# Patient Record
Sex: Female | Born: 1935 | Race: White | Hispanic: No | Marital: Married
Health system: Southern US, Community
[De-identification: ages and names within clinical notes are randomized; demographics above are authoritative.]

## PROBLEM LIST (undated history)

## (undated) DIAGNOSIS — E079 Disorder of thyroid, unspecified: Secondary | ICD-10-CM

## (undated) HISTORY — PX: APPENDECTOMY: SHX54

---

## 1999-11-08 ENCOUNTER — Ambulatory Visit (HOSPITAL_COMMUNITY): Admission: RE | Admit: 1999-11-08 | Discharge: 1999-11-08 | Payer: Self-pay | Admitting: Family Medicine

## 2000-07-16 ENCOUNTER — Encounter: Payer: Self-pay | Admitting: Family Medicine

## 2000-07-16 ENCOUNTER — Encounter: Admission: RE | Admit: 2000-07-16 | Discharge: 2000-07-16 | Payer: Self-pay | Admitting: Family Medicine

## 2000-09-07 ENCOUNTER — Other Ambulatory Visit: Admission: RE | Admit: 2000-09-07 | Discharge: 2000-09-07 | Payer: Self-pay | Admitting: Family Medicine

## 2002-03-15 ENCOUNTER — Encounter: Admission: RE | Admit: 2002-03-15 | Discharge: 2002-03-15 | Payer: Self-pay | Admitting: Family Medicine

## 2002-03-15 ENCOUNTER — Encounter: Payer: Self-pay | Admitting: Family Medicine

## 2003-09-19 ENCOUNTER — Encounter: Payer: Self-pay | Admitting: Family Medicine

## 2003-09-19 ENCOUNTER — Encounter: Admission: RE | Admit: 2003-09-19 | Discharge: 2003-09-19 | Payer: Self-pay | Admitting: Family Medicine

## 2004-02-14 ENCOUNTER — Emergency Department (HOSPITAL_COMMUNITY): Admission: EM | Admit: 2004-02-14 | Discharge: 2004-02-14 | Payer: Self-pay | Admitting: Emergency Medicine

## 2004-07-24 ENCOUNTER — Encounter (INDEPENDENT_AMBULATORY_CARE_PROVIDER_SITE_OTHER): Payer: Self-pay | Admitting: *Deleted

## 2004-07-24 ENCOUNTER — Ambulatory Visit (HOSPITAL_BASED_OUTPATIENT_CLINIC_OR_DEPARTMENT_OTHER): Admission: RE | Admit: 2004-07-24 | Discharge: 2004-07-24 | Payer: Self-pay | Admitting: Plastic Surgery

## 2004-07-24 ENCOUNTER — Ambulatory Visit (HOSPITAL_COMMUNITY): Admission: RE | Admit: 2004-07-24 | Discharge: 2004-07-24 | Payer: Self-pay | Admitting: Plastic Surgery

## 2007-10-10 ENCOUNTER — Emergency Department (HOSPITAL_COMMUNITY): Admission: EM | Admit: 2007-10-10 | Discharge: 2007-10-10 | Payer: Self-pay | Admitting: Emergency Medicine

## 2010-12-17 ENCOUNTER — Encounter
Admission: RE | Admit: 2010-12-17 | Discharge: 2010-12-17 | Payer: Medicare Other | Source: Home / Self Care | Attending: Family Medicine | Admitting: Family Medicine

## 2011-04-18 NOTE — Op Note (Signed)
[name] [name], [name] [name]                        ACCOUNT NO.:  [id number]   MEDICAL RECORD NO.:  [id number]                   PATIENT TYPE:  AMB   LOCATION:  DSC                                  FACILITY:  [facility]   PHYSICIAN:  Consuello Bossier., M.D.         DATE OF BIRTH:  Jan 27, 1924   DATE OF PROCEDURE:  DATE OF DISCHARGE:                                 OPERATIVE REPORT   DATE OF SURGERY:  July 24, 2004.   PREOPERATIVE DIAGNOSES:  One centimeter pigmented basal cell carcinoma of  left forehead with excision and closure of 2.5 cm complex wound.   POSTOPERATIVE DIAGNOSES:  One centimeter pigmented basal cell carcinoma of  left forehead with excision and closure of 2.5 cm complex wound.   SURGEON:  Pleas Patricia, Montez Hageman.,  MD.   ANESTHESIA:  Xylocaine 2% with epinephrine 1:100,000.   FINDINGS:  The patient had a recent biopsy of a pigmented basal cell  carcinoma of the left forehead for which the above-surgical procedure was  carried out.   PROCEDURE:  The patient was brought to the operating room, marked off for  the planned elliptical excision in the wrinkle lines of the above lesion.  She was prepped with Betadine, draped sterilely, and anesthetized with  Xylocaine 2% with epinephrine 1:100,000.  Following the onset of anesthesia,  the excisional biopsy was performed.  Bleeding was controlled with the eye  cautery unit.  The wound was then approximated with interrupted and running  #6-0 Prolene producing the 2.5 cm in length closure as noted above.  Neosporin ointment and light compressive dressings were applied.  The  patient tolerated the procedure well and was able to be discharged from the  operating room  to the recovery room, and subsequently to be followed by me  as an outpatient.                                               Consuello Bossier., M.D.    Tery Sanfilippo  D:  07/24/2004  T:  07/24/2004  Job:  [id number]

## 2014-06-06 ENCOUNTER — Other Ambulatory Visit: Payer: Self-pay | Admitting: Dermatology

## 2015-03-07 DIAGNOSIS — H3532 Exudative age-related macular degeneration: Secondary | ICD-10-CM | POA: Diagnosis not present

## 2015-03-07 DIAGNOSIS — H35052 Retinal neovascularization, unspecified, left eye: Secondary | ICD-10-CM | POA: Diagnosis not present

## 2015-08-27 DIAGNOSIS — H3532 Exudative age-related macular degeneration: Secondary | ICD-10-CM | POA: Diagnosis not present

## 2015-08-27 DIAGNOSIS — H35352 Cystoid macular degeneration, left eye: Secondary | ICD-10-CM | POA: Diagnosis not present

## 2015-08-27 DIAGNOSIS — H35351 Cystoid macular degeneration, right eye: Secondary | ICD-10-CM | POA: Diagnosis not present

## 2015-10-16 DIAGNOSIS — L820 Inflamed seborrheic keratosis: Secondary | ICD-10-CM | POA: Diagnosis not present

## 2015-10-16 DIAGNOSIS — I8310 Varicose veins of unspecified lower extremity with inflammation: Secondary | ICD-10-CM | POA: Diagnosis not present

## 2015-10-22 DIAGNOSIS — R609 Edema, unspecified: Secondary | ICD-10-CM | POA: Diagnosis not present

## 2015-10-22 DIAGNOSIS — L97509 Non-pressure chronic ulcer of other part of unspecified foot with unspecified severity: Secondary | ICD-10-CM | POA: Diagnosis not present

## 2015-12-10 DIAGNOSIS — K219 Gastro-esophageal reflux disease without esophagitis: Secondary | ICD-10-CM | POA: Diagnosis not present

## 2015-12-10 DIAGNOSIS — R609 Edema, unspecified: Secondary | ICD-10-CM | POA: Diagnosis not present

## 2015-12-10 DIAGNOSIS — Z79899 Other long term (current) drug therapy: Secondary | ICD-10-CM | POA: Diagnosis not present

## 2015-12-10 DIAGNOSIS — M150 Primary generalized (osteo)arthritis: Secondary | ICD-10-CM | POA: Diagnosis not present

## 2015-12-10 DIAGNOSIS — E039 Hypothyroidism, unspecified: Secondary | ICD-10-CM | POA: Diagnosis not present

## 2015-12-10 DIAGNOSIS — Z23 Encounter for immunization: Secondary | ICD-10-CM | POA: Diagnosis not present

## 2016-02-15 DIAGNOSIS — E039 Hypothyroidism, unspecified: Secondary | ICD-10-CM | POA: Diagnosis not present

## 2016-02-25 DIAGNOSIS — H353134 Nonexudative age-related macular degeneration, bilateral, advanced atrophic with subfoveal involvement: Secondary | ICD-10-CM | POA: Diagnosis not present

## 2016-02-25 DIAGNOSIS — H35351 Cystoid macular degeneration, right eye: Secondary | ICD-10-CM | POA: Diagnosis not present

## 2016-02-25 DIAGNOSIS — H35352 Cystoid macular degeneration, left eye: Secondary | ICD-10-CM | POA: Diagnosis not present

## 2016-02-25 DIAGNOSIS — H3562 Retinal hemorrhage, left eye: Secondary | ICD-10-CM | POA: Diagnosis not present

## 2016-02-25 DIAGNOSIS — H353232 Exudative age-related macular degeneration, bilateral, with inactive choroidal neovascularization: Secondary | ICD-10-CM | POA: Diagnosis not present

## 2016-03-01 DIAGNOSIS — K219 Gastro-esophageal reflux disease without esophagitis: Secondary | ICD-10-CM | POA: Diagnosis not present

## 2016-03-01 DIAGNOSIS — Z6828 Body mass index (BMI) 28.0-28.9, adult: Secondary | ICD-10-CM | POA: Diagnosis not present

## 2016-03-01 DIAGNOSIS — F3340 Major depressive disorder, recurrent, in remission, unspecified: Secondary | ICD-10-CM | POA: Diagnosis not present

## 2016-04-29 DIAGNOSIS — E039 Hypothyroidism, unspecified: Secondary | ICD-10-CM | POA: Diagnosis not present

## 2016-09-23 DIAGNOSIS — L309 Dermatitis, unspecified: Secondary | ICD-10-CM | POA: Diagnosis not present

## 2016-09-23 DIAGNOSIS — R609 Edema, unspecified: Secondary | ICD-10-CM | POA: Diagnosis not present

## 2016-09-23 DIAGNOSIS — S80811A Abrasion, right lower leg, initial encounter: Secondary | ICD-10-CM | POA: Diagnosis not present

## 2016-09-23 DIAGNOSIS — Z23 Encounter for immunization: Secondary | ICD-10-CM | POA: Diagnosis not present

## 2017-04-18 ENCOUNTER — Observation Stay (HOSPITAL_COMMUNITY)
Admission: EM | Admit: 2017-04-18 | Discharge: 2017-04-19 | Disposition: A | Discharge status: Home / Self Care | Payer: Medicare Other | Attending: Internal Medicine | Admitting: Internal Medicine

## 2017-04-18 ENCOUNTER — Encounter (HOSPITAL_COMMUNITY): Payer: Self-pay

## 2017-04-18 DIAGNOSIS — R600 Localized edema: Secondary | ICD-10-CM | POA: Diagnosis present

## 2017-04-18 DIAGNOSIS — E876 Hypokalemia: Secondary | ICD-10-CM | POA: Diagnosis not present

## 2017-04-18 DIAGNOSIS — Z79899 Other long term (current) drug therapy: Secondary | ICD-10-CM | POA: Insufficient documentation

## 2017-04-18 DIAGNOSIS — K2210 Ulcer of esophagus without bleeding: Secondary | ICD-10-CM | POA: Insufficient documentation

## 2017-04-18 DIAGNOSIS — K2100 Gastro-esophageal reflux disease with esophagitis, without bleeding: Secondary | ICD-10-CM

## 2017-04-18 DIAGNOSIS — E039 Hypothyroidism, unspecified: Secondary | ICD-10-CM | POA: Diagnosis not present

## 2017-04-18 DIAGNOSIS — K920 Hematemesis: Secondary | ICD-10-CM | POA: Diagnosis present

## 2017-04-18 DIAGNOSIS — K449 Diaphragmatic hernia without obstruction or gangrene: Secondary | ICD-10-CM

## 2017-04-18 DIAGNOSIS — Z7901 Long term (current) use of anticoagulants: Secondary | ICD-10-CM | POA: Insufficient documentation

## 2017-04-18 DIAGNOSIS — D62 Acute posthemorrhagic anemia: Secondary | ICD-10-CM | POA: Diagnosis not present

## 2017-04-18 DIAGNOSIS — K228 Other specified diseases of esophagus: Secondary | ICD-10-CM | POA: Diagnosis not present

## 2017-04-18 DIAGNOSIS — K922 Gastrointestinal hemorrhage, unspecified: Secondary | ICD-10-CM | POA: Diagnosis not present

## 2017-04-18 HISTORY — DX: Disorder of thyroid, unspecified: E07.9

## 2017-04-18 LAB — CBC WITH DIFFERENTIAL/PLATELET
BASOS PCT: 0 %
Basophils Absolute: 0 10*3/uL (ref 0.0–0.1)
EOS PCT: 0 %
Eosinophils Absolute: 0 10*3/uL (ref 0.0–0.7)
HCT: 35.5 % — ABNORMAL LOW (ref 36.0–46.0)
Hemoglobin: 11.5 g/dL — ABNORMAL LOW (ref 12.0–15.0)
LYMPHS PCT: 6 %
Lymphs Abs: 0.5 10*3/uL — ABNORMAL LOW (ref 0.7–4.0)
MCH: 27.8 pg (ref 26.0–34.0)
MCHC: 32.4 g/dL (ref 30.0–36.0)
MCV: 85.7 fL (ref 78.0–100.0)
MONO ABS: 0.3 10*3/uL (ref 0.1–1.0)
MONOS PCT: 3 %
NEUTROS ABS: 8.2 10*3/uL — AB (ref 1.7–7.7)
Neutrophils Relative %: 91 %
Platelets: 184 10*3/uL (ref 150–400)
RBC: 4.14 MIL/uL (ref 3.87–5.11)
RDW: 15.4 % (ref 11.5–15.5)
WBC: 9 10*3/uL (ref 4.0–10.5)

## 2017-04-18 LAB — URINALYSIS, ROUTINE W REFLEX MICROSCOPIC
Bilirubin Urine: NEGATIVE
Bilirubin Urine: NEGATIVE
GLUCOSE, UA: NEGATIVE mg/dL
Glucose, UA: NEGATIVE mg/dL
Hgb urine dipstick: NEGATIVE
Hgb urine dipstick: NEGATIVE
KETONES UR: 5 mg/dL — AB
Ketones, ur: 20 mg/dL — AB
LEUKOCYTES UA: NEGATIVE
Nitrite: NEGATIVE
Nitrite: NEGATIVE
PH: 8 (ref 5.0–8.0)
PH: 8 (ref 5.0–8.0)
Protein, ur: NEGATIVE mg/dL
Protein, ur: 30 mg/dL — AB
SPECIFIC GRAVITY, URINE: 1.014 (ref 1.005–1.030)
SPECIFIC GRAVITY, URINE: 1.013 (ref 1.005–1.030)
SQUAMOUS EPITHELIAL / LPF: NONE SEEN

## 2017-04-18 LAB — CBC
HEMATOCRIT: 34.7 % — AB (ref 36.0–46.0)
HEMATOCRIT: 33.5 % — AB (ref 36.0–46.0)
HEMOGLOBIN: 11.2 g/dL — AB (ref 12.0–15.0)
Hemoglobin: 10.7 g/dL — ABNORMAL LOW (ref 12.0–15.0)
MCH: 27.8 pg (ref 26.0–34.0)
MCH: 27.4 pg (ref 26.0–34.0)
MCHC: 32.3 g/dL (ref 30.0–36.0)
MCHC: 31.9 g/dL (ref 30.0–36.0)
MCV: 86.1 fL (ref 78.0–100.0)
MCV: 85.9 fL (ref 78.0–100.0)
PLATELETS: 182 10*3/uL (ref 150–400)
Platelets: 182 10*3/uL (ref 150–400)
RBC: 4.03 MIL/uL (ref 3.87–5.11)
RBC: 3.9 MIL/uL (ref 3.87–5.11)
RDW: 15.4 % (ref 11.5–15.5)
RDW: 15.6 % — AB (ref 11.5–15.5)
WBC: 8 10*3/uL (ref 4.0–10.5)
WBC: 9.2 10*3/uL (ref 4.0–10.5)

## 2017-04-18 LAB — COMPREHENSIVE METABOLIC PANEL
ALBUMIN: 3.7 g/dL (ref 3.5–5.0)
ALT: 19 U/L (ref 14–54)
AST: 23 U/L (ref 15–41)
Alkaline Phosphatase: 65 U/L (ref 38–126)
Anion gap: 8 (ref 5–15)
BILIRUBIN TOTAL: 0.6 mg/dL (ref 0.3–1.2)
BUN: 18 mg/dL (ref 6–20)
CO2: 29 mmol/L (ref 22–32)
CREATININE: 0.65 mg/dL (ref 0.44–1.00)
Calcium: 8.9 mg/dL (ref 8.9–10.3)
Chloride: 103 mmol/L (ref 101–111)
GFR calc Af Amer: >60 mL/min (ref >60)
GFR calc non Af Amer: >60 mL/min (ref >60)
GLUCOSE: 139 mg/dL — AB (ref 65–99)
POTASSIUM: 3.2 mmol/L — AB (ref 3.5–5.1)
Sodium: 140 mmol/L (ref 135–145)
TOTAL PROTEIN: 6.7 g/dL (ref 6.5–8.1)

## 2017-04-18 LAB — I-STAT CG4 LACTIC ACID, ED: Lactic Acid, Venous: 1.96 mmol/L (ref 0.5–1.9)

## 2017-04-18 LAB — ABO/RH: ABO/RH(D): A NEG

## 2017-04-18 LAB — TYPE AND SCREEN
ABO/RH(D): A NEG
Antibody Screen: NEGATIVE

## 2017-04-18 LAB — LIPASE, BLOOD: Lipase: 25 U/L (ref 11–51)

## 2017-04-18 LAB — BRAIN NATRIURETIC PEPTIDE: B Natriuretic Peptide: 92.2 pg/mL (ref 0.0–100.0)

## 2017-04-18 MED ORDER — PANTOPRAZOLE SODIUM 40 MG IV SOLR: 8.0000 mg/h | INTRAVENOUS | Status: DC

## 2017-04-18 MED ORDER — PANTOPRAZOLE SODIUM 40 MG IV SOLR: 40.0000 mg | Freq: Two times a day (BID) | INTRAVENOUS | Status: DC

## 2017-04-18 MED ORDER — LEVOTHYROXINE SODIUM 50 MCG PO TABS
75.0000 ug | ORAL_TABLET | Freq: Every day | ORAL | Status: DC
  Administered 2017-04-18: 75 ug via ORAL
  Filled 2017-04-18: qty 1

## 2017-04-18 MED ORDER — FUROSEMIDE 20 MG PO TABS
20.0000 mg | ORAL_TABLET | Freq: Every day | ORAL | Status: DC
  Administered 2017-04-18: 20 mg via ORAL
  Filled 2017-04-18: qty 1

## 2017-04-18 MED ORDER — SODIUM CHLORIDE 0.9 % IV SOLN
80.0000 mg | Freq: Once | INTRAVENOUS | Status: AC
  Administered 2017-04-18: 80 mg via INTRAVENOUS
  Filled 2017-04-18 (×2): qty 80

## 2017-04-18 MED ORDER — ALBUTEROL SULFATE (2.5 MG/3ML) 0.083% IN NEBU: 2.5000 mg | INHALATION_SOLUTION | RESPIRATORY_TRACT | Status: DC | PRN

## 2017-04-18 MED ORDER — SODIUM CHLORIDE 0.9 % IV SOLN
INTRAVENOUS | Status: DC
  Administered 2017-04-18: 14:00:00 via INTRAVENOUS

## 2017-04-18 MED ORDER — ACETAMINOPHEN 650 MG RE SUPP
650.0000 mg | Freq: Four times a day (QID) | RECTAL | Status: DC | PRN
Start: 2017-04-18 — End: 2017-04-19

## 2017-04-18 MED ORDER — ONDANSETRON HCL 4 MG/2ML IJ SOLN: 4.0000 mg | Freq: Four times a day (QID) | INTRAMUSCULAR | Status: DC | PRN

## 2017-04-18 MED ORDER — SODIUM CHLORIDE 0.9 % IV SOLN: INTRAVENOUS | Status: DC

## 2017-04-18 MED ORDER — SODIUM CHLORIDE 0.9 % IV SOLN: 80.0000 mg | Freq: Once | INTRAVENOUS | Status: DC

## 2017-04-18 MED ORDER — ONDANSETRON HCL 4 MG PO TABS: 4.0000 mg | ORAL_TABLET | Freq: Four times a day (QID) | ORAL | Status: DC | PRN

## 2017-04-18 MED ORDER — ACETAMINOPHEN 325 MG PO TABS: 650.0000 mg | ORAL_TABLET | Freq: Four times a day (QID) | ORAL | Status: DC | PRN

## 2017-04-18 MED ORDER — HYDRALAZINE HCL 20 MG/ML IJ SOLN: 10.0000 mg | Freq: Four times a day (QID) | INTRAMUSCULAR | Status: DC | PRN

## 2017-04-18 MED ORDER — ONDANSETRON HCL 4 MG/2ML IJ SOLN
4.0000 mg | Freq: Once | INTRAMUSCULAR | Status: AC
  Administered 2017-04-18: 4 mg via INTRAVENOUS
  Filled 2017-04-18: qty 2

## 2017-04-18 MED ORDER — POTASSIUM CHLORIDE CRYS ER 20 MEQ PO TBCR
40.0000 meq | EXTENDED_RELEASE_TABLET | Freq: Once | ORAL | Status: AC
  Administered 2017-04-18: 40 meq via ORAL
  Filled 2017-04-18: qty 2

## 2017-04-18 MED ORDER — PANTOPRAZOLE SODIUM 40 MG IV SOLR
40.0000 mg | Freq: Once | INTRAVENOUS | Status: AC
  Administered 2017-04-18: 40 mg via INTRAVENOUS
  Filled 2017-04-18: qty 40

## 2017-04-18 MED ORDER — SODIUM CHLORIDE 0.9 % IV BOLUS (SEPSIS)
1000.0000 mL | Freq: Once | INTRAVENOUS | Status: AC
  Administered 2017-04-18: 1000 mL via INTRAVENOUS

## 2017-04-18 MED ORDER — SODIUM CHLORIDE 0.9% FLUSH: 3.0000 mL | Freq: Two times a day (BID) | INTRAVENOUS | Status: DC

## 2017-04-18 MED ORDER — SODIUM CHLORIDE 0.9 % IV SOLN
8.0000 mg/h | INTRAVENOUS | Status: DC
  Administered 2017-04-18 – 2017-04-19 (×2): 8 mg/h via INTRAVENOUS
  Filled 2017-04-18 (×6): qty 80

## 2017-04-18 NOTE — Consult Note (Signed)
Referring Provider: Dr. Clarene DukeLittle, EDP Primary Care Physician:  System, Pcp Not In Primary Gastroenterologist:  none  Reason for Consultation:  CGE  HPI: [name] [name] is a 83 y.o. female w/ h/o thyroid disease who presents with hematemesis. Around 5 pm yesterday, she was walking at her nursing facility and began vomiting blood.  She says that she was up all night vomiting after that.  She says that she cannot see well due to macular degeneration, so is unsure what the subsequent vomiting episodes really looked like.  Her family reports a large blood stain on the carpet when they arrived this AM.  She reports associated burning abdominal discomfort.  Says that prior to last night she was feeling well.  No similar episodes in the past, no history of ulcer.  Never saw a GI MD or had EGD/colonoscopy. She denies abdominal pain currently. EMS reported coffee ground emesis that they saw. No associated diarrhea or constipation. Unsure if there has been any black or bloody stools.  She reports feeling dizzy and fatigued. She is currently on celebrex, but no anticoagulant use or alcohol use. She notes BLE edema that began last spring.  The history is provided by the patient.   Hgb 11.5 grams--none for comparison.  BUN normal.   Past Medical History:  Diagnosis Date  . Thyroid disease     Past Surgical History:  Procedure Laterality Date  . APPENDECTOMY      Prior to Admission medications   Medication Sig Start Date End Date Taking? Authorizing Provider  celecoxib (CELEBREX) 200 MG capsule Take 200 mg by mouth daily.    Yes [provider]  esomeprazole (NEXIUM) 40 MG capsule Take 40 mg by mouth daily.    Yes [provider]  levothyroxine (SYNTHROID, LEVOTHROID) 75 MCG tablet Take 75 mcg by mouth daily before breakfast.    Yes [provider]    Current Facility-Administered Medications  Medication Dose Route Frequency Provider Last Rate Last Dose  .  pantoprazole (PROTONIX) 80 mg in sodium chloride 0.9 % 100 mL IVPB  80 mg Intravenous Once Zehr, Jessica D, PA-C      . pantoprazole (PROTONIX) 80 mg in sodium chloride 0.9 % 250 mL (0.32 mg/mL) infusion  8 mg/hr Intravenous Continuous Zehr, Jessica D, PA-C      . [START ON 04/22/2017] pantoprazole (PROTONIX) injection 40 mg  40 mg Intravenous Q12H Zehr, Princella PellegriniJessica D, PA-C       Current Outpatient Prescriptions  Medication Sig Dispense Refill  . celecoxib (CELEBREX) 200 MG capsule Take 200 mg by mouth daily.     Marland Kitchen. esomeprazole (NEXIUM) 40 MG capsule Take 40 mg by mouth daily.     Marland Kitchen. levothyroxine (SYNTHROID, LEVOTHROID) 75 MCG tablet Take 75 mcg by mouth daily before breakfast.       Allergies as of 04/18/2017  . (No Known Allergies)    History reviewed. No pertinent family history.  Social History   Social History  . Marital status: Married    Spouse name: N/A  . Number of children: N/A  . Years of education: N/A   Occupational History  . Not on file.   Social History Main Topics  . Smoking status: Never Smoker  . Smokeless tobacco: Never Used  . Alcohol use No  . Drug use: Unknown  . Sexual activity: Not on file   Other Topics Concern  . Not on file   Social History Narrative  . No narrative on file  Review of Systems: ROS is O/W negative except as mentioned in HPI.  Physical Exam: Vital signs in last 24 hours: Temp:  [97.9 F (36.6 C)] 97.9 F (36.6 C) (05/19 0812) Pulse Rate:  [64-70] 70 (05/19 1055) Resp:  [18] 18 (05/19 1055) BP: (168-189)/(73-79) 168/73 (05/19 1055) SpO2:  [96 %-98 %] 96 % (05/19 1055)   General:  Alert, Well-developed, well-nourished, pleasant and cooperative in NAD Head:  Normocephalic and atraumatic. Eyes:  Sclera clear, no icterus.  Conjunctiva pink. Ears:  Normal auditory acuity. Mouth:  No deformity or lesions.   Lungs:  Clear throughout to auscultation.  No wheezes, crackles, or rhonchi.  No increased WOB. Heart:  Regular rate  and rhythm; no murmurs, clicks, rubs,  or gallops. Abdomen:  Soft, non-distended.  BS present.  Non-tender. Rectal:  Deferred  Msk:  Symmetrical without gross deformities. Pulses:  Normal pulses noted. Extremities:  2-3+ pitting edema in B/L LE's Neurologic:  Alert and oriented x 4;  grossly normal neurologically. Skin:  Intact without significant lesions or rashes. Psych:  Alert and cooperative. Normal mood and affect.  Lab Results:  Recent Labs  04/18/17 0843  WBC 9.0  HGB 11.5*  HCT 35.5*  PLT 184   BMET  Recent Labs  04/18/17 0843  NA 140  K 3.2*  CL 103  CO2 29  GLUCOSE 139*  BUN 18  CREATININE 0.65  CALCIUM 8.9   LFT  Recent Labs  04/18/17 0843  PROT 6.7  ALBUMIN 3.7  AST 23  ALT 19  ALKPHOS 65  BILITOT 0.6   IMPRESSION:  -83 year old female with hematemesis:  ? Ulcer due to NSAID use vs other sources. -NSAID use with celebrex  PLAN: -PPI gtt for now. -EGD with Dr. Christella Hartigan 5/20 AM. -Monitor Hgb and transfuse prn.   ZEHR, JESSICA D.  04/18/2017, 12:46 PM  Pager number [phone]   ________________________________________________________________________  [facility] MD note:  I personally examined the patient, reviewed the data and agree with the assessment and plan described above.  She's pretty HOH but otherwise well.  Hematemesis at [facility].  She's been HD stable and Hb is near normal.  IV PPI was started this morning in ER, I am planning on EGD tomorrow.  DDx includes PUD, AVM, cancer (which I think is unlikely).   Rob Bunting, MD [facility] Pager [phone]

## 2017-04-18 NOTE — ED Provider Notes (Signed)
[facility]-EMERGENCY DEPT Provider Note   CSN: [id number] Arrival date & time: 04/18/17  0759     History   Chief Complaint Chief Complaint  Patient presents with  . Hematemesis    HPI [name] [name] is a 83 y.o. female.  83 year old female w/ h/o thyroid disease who presents with hematemesis. Around 5 pm yesterday, she was walking at her nursing facility and began vomiting blood; she cannot recall whether it was dark or bright. She reports associated burning, generalized abdominal pain. She denies abdominal pain currently. EMS reports coffee ground emesis that they saw. No associated diarrhea or constipation. She reports feeling dizzy and fatigued. She is currently on celebrex, no anticoagulant use or alcohol use. She notes BLE edema that began last fall and has persisted since then.   The history is provided by the patient.    Past Medical History:  Diagnosis Date  . Thyroid disease     There are no active problems to display for this patient.   Past Surgical History:  Procedure Laterality Date  . APPENDECTOMY      OB History    No data available       Home Medications    Prior to Admission medications   Medication Sig Start Date End Date Taking? Authorizing Provider  celecoxib (CELEBREX) 200 MG capsule Take 200 mg by mouth daily.    Yes [provider]  esomeprazole (NEXIUM) 40 MG capsule Take 40 mg by mouth daily.    Yes [provider]  levothyroxine (SYNTHROID, LEVOTHROID) 75 MCG tablet Take 75 mcg by mouth daily before breakfast.    Yes [provider]    Family History History reviewed. No pertinent family history.  Social History Social History  Substance Use Topics  . Smoking status: Never Smoker  . Smokeless tobacco: Never Used  . Alcohol use No     Allergies   Patient has no known allergies.   Review of Systems Review of Systems All other systems reviewed and are negative except that which was mentioned in  HPI  Physical Exam Updated Vital Signs BP (!) 168/73   Pulse 70   Temp 97.9 F (36.6 C) (Oral)   Resp 18   SpO2 96%   Physical Exam  Constitutional: She is oriented to person, place, and time. She appears well-developed and well-nourished. No distress.  HENT:  Head: Normocephalic and atraumatic.  Moist mucous membranes  Eyes: Conjunctivae are normal. Pupils are equal, round, and reactive to light.  Neck: Neck supple.  Cardiovascular: Normal rate and regular rhythm.   Murmur heard. Pulmonary/Chest: Effort normal and breath sounds normal.  Abdominal: Soft. Bowel sounds are normal. She exhibits no distension. There is no tenderness.  Musculoskeletal: She exhibits edema (2+ pitting BLE).  Neurological: She is alert and oriented to person, place, and time.  Hard of hearing, poor vision  Skin: Skin is warm and dry.  Scattered areas of dried dark blood on clothes and neck  Psychiatric: She has a normal mood and affect. Judgment normal.  Nursing note and vitals reviewed.    ED Treatments / Results  Labs (all labs ordered are listed, but only abnormal results are displayed) Labs Reviewed  COMPREHENSIVE METABOLIC PANEL - Abnormal; Notable for the following:       Result Value   Potassium 3.2 (*)    Glucose, Bld 139 (*)    All other components within normal limits  CBC WITH DIFFERENTIAL/PLATELET - Abnormal; Notable for the following:    Hemoglobin  11.5 (*)    HCT 35.5 (*)    Neutro Abs 8.2 (*)    Lymphs Abs 0.5 (*)    All other components within normal limits  I-STAT CG4 LACTIC ACID, ED - Abnormal; Notable for the following:    Lactic Acid, Venous 1.96 (*)    All other components within normal limits  LIPASE, BLOOD  BRAIN NATRIURETIC PEPTIDE  URINALYSIS, ROUTINE W REFLEX MICROSCOPIC  TYPE AND SCREEN  ABO/RH    EKG  EKG Interpretation None       Radiology No results found.  Procedures Procedures (including critical care time)  Medications Ordered in  ED Medications  sodium chloride 0.9 % bolus 1,000 mL (1,000 mLs Intravenous New Bag/Given 04/18/17 0941)  pantoprazole (PROTONIX) injection 40 mg (40 mg Intravenous Given 04/18/17 0940)  ondansetron (ZOFRAN) injection 4 mg (4 mg Intravenous Given 04/18/17 0940)     Initial Impression / Assessment and Plan / ED Course  I have reviewed the triage vital signs and the nursing notes.  Pertinent labs that were available during my care of the patient were reviewed by me and considered in my medical decision making (see chart for details).     Pt With several episodes of hematemesis beginning last night associated with burning epigastric pain. On exam she was resting comfortably with reassuring vital signs, mildly hypertensive. She had no focal abdominal tenderness. She stated that her nausea was improved. She did have some dried blood on her clothing suggestive of upper GI bleed. Gave IV protonic's and fluid bolus. Obtained above labs which is notable for lactate of 1.96, hemoglobin 11.5 with no recent previous to compare, normal BUN and creatinine. I discussed her case with gastroenterology, Dr. Christella Hartigan, who will see the patient in consultation. Because of her anemia and GI bleeding of unclear etiology, discussed admission with hospitalist, Dr. Jerral Ralph, and pt admitted for further care.  Final Clinical Impressions(s) / ED Diagnoses   Final diagnoses:  None    New Prescriptions New Prescriptions   No medications on file     Emigdio Wildeman, Ambrose Finland, MD 04/18/17 1140

## 2017-04-18 NOTE — ED Notes (Signed)
PT aware of urine sample 

## 2017-04-18 NOTE — ED Triage Notes (Signed)
She comes to us from [facility] independent [facility] with c/o "I've been upchucking all night". Paramedics state pt. Emesis is "real dark". Pt. Arrives pain-free and in no distress.

## 2017-04-18 NOTE — H&P (Signed)
HISTORY AND PHYSICAL       PATIENT DETAILS Name: [name] [name] Age: 83 y.o. Sex: female Date of Birth: 19-Jun-1924 Admit Date: 04/18/2017 ZOX:[id number]:System, Pcp Not In   Patient coming from: Independent living facility   CHIEF COMPLAINT:  Hematemesis since yesterday evening  HPI: [name] [name] is a 83 y.o. female with medical history significant of hypothyroidism presented to the hospital for evaluation of the above-noted complaints. Per patient, she started developing vomiting with coffee ground emesis yesterday evening. She is at multiple episodes of hematemesis overnight. She was subsequently brought to the ED for further evaluation, she was found to have a hemoglobin level of 11.5 (no prior levels to compare with), and the hospitalist service was asked to admit this patient for further evaluation and treatment.  Patient is a poor historian-she barely can see and is very hard of hearing.  She denies any use of antiplatelet agents or anticoagulants. No prior history of GI bleeding.  No history of chest pain, shortness of breath, abdominal pain, diarrhea.  ED Course:  Given IV PPI, GI consulted and the hospitalist service asked to admit this patient.  Note: Lives at: ILF Mobility: Cane/[place] Chronic Indwelling Foley:no   REVIEW OF SYSTEMS:  Constitutional:   No  weight loss, night sweats,  Fevers, chills, fatigue.  HEENT:    No headaches, Dysphagia,Tooth/dental problems,Sore throat,   Cardio-vascular: No chest pain,Orthopnea, PND,lower extremity edema, anasarca, palpitations  GI:  No heartburn, indigestion, abdominal pain,  diarrhea, melena or hematochezia  Resp: No shortness of breath, cough, hemoptysis,plueritic chest pain.   Skin:  No rash or lesions.  GU:  No dysuria, change in color of urine, no urgency or frequency.  No flank pain.  Musculoskeletal: No joint pain or swelling.  No decreased range of motion.    Endocrine: No heat  intolerance, no cold intolerance, no polyuria, no polydipsia  Psych:  No memory loss.   ALLERGIES:  No Known Allergies  PAST MEDICAL HISTORY: Past Medical History:  Diagnosis Date  . Thyroid disease     PAST SURGICAL HISTORY: Past Surgical History:  Procedure Laterality Date  . APPENDECTOMY      MEDICATIONS AT HOME: Prior to Admission medications   Medication Sig Start Date End Date Taking? Authorizing Provider  celecoxib (CELEBREX) 200 MG capsule Take 200 mg by mouth daily.    Yes [provider]  esomeprazole (NEXIUM) 40 MG capsule Take 40 mg by mouth daily.    Yes [provider]  levothyroxine (SYNTHROID, LEVOTHROID) 75 MCG tablet Take 75 mcg by mouth daily before breakfast.    Yes [provider]    FAMILY HISTORY: No family history of CAD  SOCIAL HISTORY:  reports that she has never smoked. She has never used smokeless tobacco. She reports that she does not drink alcohol. Her drug history is not on file.  PHYSICAL EXAM: Blood pressure (!) 168/73, pulse 70, temperature 97.9 F (36.6 C), temperature source Oral, resp. rate 18, SpO2 96 %.  General appearance :Awake, alert, not in any distress. Eyes:, pupils equally reactive to light and accomodation,no scleral icterus.Pink conjunctiva HEENT: Atraumatic and Normocephalic Neck: supple, no JVD. No cervical lymphadenopathy.  Resp:Good air entry bilaterally, no added sounds  CVS: S1 S2 regular, +syst murmur GI: Bowel sounds present, Non tender and not distended with no gaurding, rigidity or rebound. Extremities: B/L Lower Ext shows ++ edema, both legs are warm to touch Neurology:  speech clear,Non focal, sensation is grossly  intact. Psychiatric: Normal judgment and insight. Alert and oriented x 3.  Musculoskeletal:gait appears to be normal.No digital cyanosis Skin:No Rash, warm and dry Wounds:N/A  LABS ON ADMISSION:  I have personally reviewed following labs and imaging  studies  CBC:  Recent Labs Lab 04/18/17 0843  WBC 9.0  NEUTROABS 8.2*  HGB 11.5*  HCT 35.5*  MCV 85.7  PLT 184    Basic Metabolic Panel:  Recent Labs Lab 04/18/17 0843  NA 140  K 3.2*  CL 103  CO2 29  GLUCOSE 139*  BUN 18  CREATININE 0.65  CALCIUM 8.9    GFR: CrCl cannot be calculated (Unknown ideal weight.).  Liver Function Tests:  Recent Labs Lab 04/18/17 0843  AST 23  ALT 19  ALKPHOS 65  BILITOT 0.6  PROT 6.7  ALBUMIN 3.7    Recent Labs Lab 04/18/17 0843  LIPASE 25   No results for input(s): AMMONIA in the last 168 hours.  Coagulation Profile: No results for input(s): INR, PROTIME in the last 168 hours.  Cardiac Enzymes: No results for input(s): CKTOTAL, CKMB, CKMBINDEX, TROPONINI in the last 168 hours.  BNP (last 3 results) No results for input(s): PROBNP in the last 8760 hours.  HbA1C: No results for input(s): HGBA1C in the last 72 hours.  CBG: No results for input(s): GLUCAP in the last 168 hours.  Lipid Profile: No results for input(s): CHOL, HDL, LDLCALC, TRIG, CHOLHDL, LDLDIRECT in the last 72 hours.  Thyroid Function Tests: No results for input(s): TSH, T4TOTAL, FREET4, T3FREE, THYROIDAB in the last 72 hours.  Anemia Panel: No results for input(s): VITAMINB12, FOLATE, FERRITIN, TIBC, IRON, RETICCTPCT in the last 72 hours.  Urine analysis: No results found for: COLORURINE, APPEARANCEUR, LABSPEC, PHURINE, GLUCOSEU, HGBUR, BILIRUBINUR, KETONESUR, PROTEINUR, UROBILINOGEN, NITRITE, LEUKOCYTESUR  Sepsis Labs: Lactic Acid, Venous    Component Value Date/Time   LATICACIDVEN 1.96 (HH) 04/18/2017 1610     Microbiology: No results found for this or any previous visit (from the past 240 hour(s)).    RADIOLOGIC STUDIES ON ADMISSION: No results found.   ASSESSMENT AND PLAN: Upper GI bleeding: Start PPI infusion-follow CBC-keep nothing by mouth until GI evaluation. Denies use of any antiplatelets or anticoagulation. Probably  will require endoscopic evaluation.  Mild acute blood loss anemia: Anemia appears mild at present-no prior baseline to compare with-follow CBC and transfuse accordingly.  Hypokalemia: Replete and recheck  Hypothyroidism: Continue Synthroid  Bilateral lower extremity edema: The patient this has been ongoing since the start of this year. We'll check a UA-start on low-dose Lasix. Given advanced age not sure if further investigations will change her management   Further plan will depend as patient's clinical course evolves and further radiologic and laboratory data become available. Patient will be monitored closely.  Above noted plan was discussed with patient/stepdaughter face to face at bedside, they were in agreement.   CONSULTS: None   DVT Prophylaxis: SCD's  Code Status: DNR-confirmed with patient at bedside-note family present at bedside.  Disposition Plan:  Discharge back home in 2 days, depending on clinical course  Admission status: Inpatient  going to tele  Total time spent  55 minutes.Greater than 50% of this time was spent in counseling, explanation of diagnosis, planning of further management, and coordination of care.  Jeoffrey Massed [facility] Pager [phone]  If 7PM-7AM, please contact night-coverage [email or web address] Password [email or web address] 04/18/2017, 12:08 PM

## 2017-04-19 ENCOUNTER — Encounter (HOSPITAL_COMMUNITY): Payer: Self-pay | Admitting: *Deleted

## 2017-04-19 ENCOUNTER — Encounter (HOSPITAL_COMMUNITY)
Admission: EM | Disposition: A | Discharge status: Home / Self Care | Payer: Self-pay | Source: Home / Self Care | Attending: Emergency Medicine

## 2017-04-19 DIAGNOSIS — K2100 Gastro-esophageal reflux disease with esophagitis, without bleeding: Secondary | ICD-10-CM

## 2017-04-19 DIAGNOSIS — K449 Diaphragmatic hernia without obstruction or gangrene: Secondary | ICD-10-CM | POA: Diagnosis not present

## 2017-04-19 DIAGNOSIS — K920 Hematemesis: Secondary | ICD-10-CM

## 2017-04-19 DIAGNOSIS — K922 Gastrointestinal hemorrhage, unspecified: Secondary | ICD-10-CM | POA: Diagnosis present

## 2017-04-19 HISTORY — PX: ESOPHAGOGASTRODUODENOSCOPY: SHX5428

## 2017-04-19 LAB — CBC
HEMATOCRIT: 33.5 % — AB (ref 36.0–46.0)
HEMATOCRIT: 33.6 % — AB (ref 36.0–46.0)
Hemoglobin: 10.4 g/dL — ABNORMAL LOW (ref 12.0–15.0)
Hemoglobin: 10.5 g/dL — ABNORMAL LOW (ref 12.0–15.0)
MCH: 26.9 pg (ref 26.0–34.0)
MCH: 27.1 pg (ref 26.0–34.0)
MCHC: 31 g/dL (ref 30.0–36.0)
MCHC: 31.3 g/dL (ref 30.0–36.0)
MCV: 86.8 fL (ref 78.0–100.0)
MCV: 86.6 fL (ref 78.0–100.0)
PLATELETS: 160 10*3/uL (ref 150–400)
Platelets: 160 10*3/uL (ref 150–400)
RBC: 3.87 MIL/uL (ref 3.87–5.11)
RBC: 3.87 MIL/uL (ref 3.87–5.11)
RDW: 15.4 % (ref 11.5–15.5)
RDW: 15.5 % (ref 11.5–15.5)
WBC: 6.5 10*3/uL (ref 4.0–10.5)
WBC: 6.6 10*3/uL (ref 4.0–10.5)

## 2017-04-19 LAB — BASIC METABOLIC PANEL
Anion gap: 9 (ref 5–15)
BUN: 15 mg/dL (ref 6–20)
CHLORIDE: 103 mmol/L (ref 101–111)
CO2: 27 mmol/L (ref 22–32)
CREATININE: 0.82 mg/dL (ref 0.44–1.00)
Calcium: 8.6 mg/dL — ABNORMAL LOW (ref 8.9–10.3)
GFR calc Af Amer: >60 mL/min (ref >60)
GFR calc non Af Amer: >60 mL/min (ref >60)
Glucose, Bld: 99 mg/dL (ref 65–99)
Potassium: 3.5 mmol/L (ref 3.5–5.1)
Sodium: 139 mmol/L (ref 135–145)

## 2017-04-19 SURGERY — EGD (ESOPHAGOGASTRODUODENOSCOPY)
Anesthesia: Moderate Sedation

## 2017-04-19 MED ORDER — MIDAZOLAM HCL 5 MG/ML IJ SOLN
INTRAMUSCULAR | Status: AC
  Filled 2017-04-19: qty 2

## 2017-04-19 MED ORDER — FENTANYL CITRATE (PF) 100 MCG/2ML IJ SOLN
INTRAMUSCULAR | Status: AC
  Filled 2017-04-19: qty 2

## 2017-04-19 MED ORDER — ESOMEPRAZOLE MAGNESIUM 40 MG PO CPDR: 40.0000 mg | DELAYED_RELEASE_CAPSULE | Freq: Two times a day (BID) | ORAL | 2 refills | Status: DC

## 2017-04-19 MED ORDER — BUTAMBEN-TETRACAINE-BENZOCAINE 2-2-14 % EX AERO
INHALATION_SPRAY | CUTANEOUS | Status: DC | PRN
  Administered 2017-04-19: 1 via TOPICAL

## 2017-04-19 MED ORDER — FENTANYL CITRATE (PF) 100 MCG/2ML IJ SOLN
INTRAMUSCULAR | Status: DC | PRN
  Administered 2017-04-19 (×3): 12.5 ug via INTRAVENOUS

## 2017-04-19 MED ORDER — MIDAZOLAM HCL 10 MG/2ML IJ SOLN
INTRAMUSCULAR | Status: DC | PRN
  Administered 2017-04-19 (×3): 1 mg via INTRAVENOUS

## 2017-04-19 MED ORDER — PANTOPRAZOLE SODIUM 40 MG PO TBEC: 40.0000 mg | DELAYED_RELEASE_TABLET | Freq: Two times a day (BID) | ORAL | Status: DC

## 2017-04-19 NOTE — Discharge Summary (Signed)
Physician Discharge Summary  [name] [name] ZOX:[id number] DOB: [birth year] DOA: 04/18/2017  PCP: System, Pcp Not In  Admit date: 04/18/2017 Discharge date: 04/19/2017  Time spent: 45 minutes  Recommendations for Outpatient Follow-up:  -Will be discharged home today. -Follow up with GI in 4 weeks. -Nexium has been increased to BID.   Discharge Diagnoses:  Active Problems:   GI bleed   Acute blood loss anemia   Hypothyroid   Lower extremity edema   Hematemesis with nausea   Hiatal hernia   Gastroesophageal reflux disease with esophagitis   Discharge Condition: Stable and improved  Filed Weights   04/19/17 0557 04/19/17 1055  Weight: 71 kg (156 lb 8.4 oz) 71 kg (156 lb 8.4 oz)    History of present illness:  As per Dr. Jerral Ralph on 5/19: [name] [name] is a 83 y.o. female with medical history significant of hypothyroidism presented to the hospital for evaluation of the above-noted complaints. Per patient, she started developing vomiting with coffee ground emesis yesterday evening. She is at multiple episodes of hematemesis overnight. She was subsequently brought to the ED for further evaluation, she was found to have a hemoglobin level of 11.5 (no prior levels to compare with), and the hospitalist service was asked to admit this patient for further evaluation and treatment.  Patient is a poor historian-she barely can see and is very hard of hearing.  She denies any use of antiplatelet agents or anticoagulants. No prior history of GI bleeding.  No history of chest pain, shortness of breath, abdominal pain, diarrhea.  Hospital Course:   Hematemesis -No episodes since admission. -Hb has been stable in the 10.4-11.2 range. -EGD: Impression:               - Large hiatal hernia with Cameron's erosions and                            associated reflux related esophagitis                           - No blood in the UGI tract. -GI recommends increasing her PPI to  BID. -Have advised cessation of daily celecoxib and other NSAIDs should be used sparingly.  Rest of chronic conditions have been stable during this <24 hour hospitalization.  Procedures:  EGD as above   Consultations:  GI, Dr. Christella Hartigan  Discharge Instructions  Discharge Instructions    Increase activity slowly    Complete by:  As directed      Allergies as of 04/19/2017   No Known Allergies     Medication List    STOP taking these medications   celecoxib 200 MG capsule Commonly known as:  CELEBREX     TAKE these medications   esomeprazole 40 MG capsule Commonly known as:  NEXIUM Take 1 capsule (40 mg total) by mouth 2 (two) times daily before a meal. What changed:  when to take this   levothyroxine 75 MCG tablet Commonly known as:  SYNTHROID, LEVOTHROID Take 75 mcg by mouth daily before breakfast.      No Known Allergies Follow-up Information    Rachael Fee, MD. Schedule an appointment as soon as possible for a visit in 4 week(s).   Specialty:  Gastroenterology Contact information: [address] [place] [address] [phone]            The results of significant diagnostics from  this hospitalization (including imaging, microbiology, ancillary and laboratory) are listed below for reference.    Significant Diagnostic Studies: No results found.  Microbiology: No results found for this or any previous visit (from the past 240 hour(s)).   Labs: Basic Metabolic Panel:  Recent Labs Lab 04/18/17 0843 04/19/17 0543  NA 140 139  K 3.2* 3.5  CL 103 103  CO2 29 27  GLUCOSE 139* 99  BUN 18 15  CREATININE 0.65 0.82  CALCIUM 8.9 8.6*   Liver Function Tests:  Recent Labs Lab 04/18/17 0843  AST 23  ALT 19  ALKPHOS 65  BILITOT 0.6  PROT 6.7  ALBUMIN 3.7    Recent Labs Lab 04/18/17 0843  LIPASE 25   No results for input(s): AMMONIA in the last 168 hours. CBC:  Recent Labs Lab 04/18/17 0843 04/18/17 1408 04/18/17 2139  04/19/17 0543  WBC 9.0 8.0 9.2 6.5  6.6  NEUTROABS 8.2*  --   --   --   HGB 11.5* 11.2* 10.7* 10.4*  10.5*  HCT 35.5* 34.7* 33.5* 33.5*  33.6*  MCV 85.7 86.1 85.9 86.6  86.8  PLT 184 182 182 160  160   Cardiac Enzymes: No results for input(s): CKTOTAL, CKMB, CKMBINDEX, TROPONINI in the last 168 hours. BNP: BNP (last 3 results)  Recent Labs  04/18/17 0849  BNP 92.2    ProBNP (last 3 results) No results for input(s): PROBNP in the last 8760 hours.  CBG: No results for input(s): GLUCAP in the last 168 hours.     SignedChaya Jan:  [name],[name]  [facility] Pager: [phone] 04/19/2017, 2:06 PM

## 2017-04-19 NOTE — Care Management Note (Signed)
Case Management Note  Patient Details  Name: Alcide CleverMae Parker-Boles MRN: [id number] Date of Birth: Mar 19, 1924  Subjective/Objective:     GI bleed, GERD               Action/Plan: Discharge Planning: NCM spoke to pt and step-dtr at bedside. Pt lives at [facility] in [place] She has RW at home. She has caregivers that come in during the day to assist with care. Pt's son works at [employer] and will be in to check on tomorrow.     Expected Discharge Date:  04/19/17               Expected Discharge Plan:  Home/Self Care  In-House Referral:  NA  Discharge planning Services  CM Consult  Post Acute Care Choice:  NA Choice offered to:  NA  DME Arranged:  N/A DME Agency:  NA  HH Arranged:  NA HH Agency:  NA  Status of Service:  Completed, signed off  If discussed at Long Length of Stay Meetings, dates discussed:    Additional Comments:  Elliot CousinShavis, Adolpho Meenach Ellen, RN 04/19/2017, 3:25 PM

## 2017-04-19 NOTE — Care Management CC44 (Signed)
Condition Code 44 Documentation Completed  Patient Details  Name: [name] [name] MRN: [id number] Date of Birth: 10-14-1924   Condition Code 44 given:  Yes Patient signature on Condition Code 44 notice:  Yes Documentation of 2 MD's agreement:  Yes Code 44 added to claim:  Yes    Elliot CousinShavis, Clinten Howk Ellen, RN 04/19/2017, 2:58 PM

## 2017-04-19 NOTE — Op Note (Signed)
[facility] Patient Name: [name] [name] Procedure Date: 04/19/2017 MRN: [id number] Attending MD: Rachael Fee , MD Date of Birth: [birth year] CSN: [id number] Age: 83 Admit Type: Inpatient Procedure:                Upper GI endoscopy Indications:              Hematemesis Providers:                Rachael Fee, MD, Roselie Awkward, RN, Zoila Shutter, Technician, Harrington Challenger, Technician Referring MD:              Medicines:                Fentanyl 30 micrograms IV, Midazolam 3 mg IV Complications:            No immediate complications. Estimated blood loss:                            None. Estimated Blood Loss:     Estimated blood loss: none. Procedure:                Pre-Anesthesia Assessment:                           - Prior to the procedure, a History and Physical                            was performed, and patient medications and                            allergies were reviewed. The patient's tolerance of                            previous anesthesia was also reviewed. The risks                            and benefits of the procedure and the sedation                            options and risks were discussed with the patient.                            All questions were answered, and informed consent                            was obtained. Prior Anticoagulants: The patient has                            taken no previous anticoagulant or antiplatelet                            agents. ASA Grade Assessment: III - A patient with  severe systemic disease. After reviewing the risks                            and benefits, the patient was deemed in                            satisfactory condition to undergo the procedure.                           After obtaining informed consent, the endoscope was                            passed under direct vision. Throughout the                            procedure,  the patient's blood pressure, pulse, and                            oxygen saturations were monitored continuously. The                            Endoscope was introduced through the mouth, and                            advanced to the second part of duodenum. The upper                            GI endoscopy was accomplished without difficulty.                            The patient tolerated the procedure well. Scope In: Scope Out: Findings:      Large hiatal hernia with a few small Cameron's type erosions. The hernia       is causing typica forshortening and tortuosity of the distal esophagus.       There is a mild focal peptic stricture (vs. thick Schatzki's type ring).       This was not dilated given no reported history of dysphagia. There was       erosive distal reflux related esopahgitis (LA Class C).      The exam was otherwise without abnormality. Impression:               - Large hiatal hernia with Cameron's erosions and                            associated reflux related esophagitis                           - No blood in the UGI tract. Moderate Sedation:      Moderate (conscious) sedation was administered by the endoscopy nurse       and supervised by the endoscopist. The following parameters were       monitored: oxygen saturation, heart rate, blood pressure, and response       to care. Total physician intraservice time was 20 minutes. Recommendation:           -  Return patient to hospital ward for ongoing care.                           - Advance diet as tolerated. She needs to chew her                            food well, eat slowly and take small bites.                           - Continue present medications. She was on PPI once                            daily at admit but I think it is safest if she                            continue twice daily PPI indefinitely.                           - OK to d/c tomorrow if no recurrent hematemesis. Procedure Code(s):         --- Professional ---                           [phone], Esophagogastroduodenoscopy, flexible,                            transoral; diagnostic, including collection of                            specimen(s) by brushing or washing, when performed                            (separate procedure)                           99152, Moderate sedation services provided by the                            same physician or other qualified health care                            professional performing the diagnostic or                            therapeutic service that the sedation supports,                            requiring the presence of an independent trained                            observer to assist in the monitoring of the                            patient's level of consciousness and physiological  status; initial 15 minutes of intraservice time,                            patient age 1 years or older Diagnosis Code(s):        --- Professional ---                           K44.9, Diaphragmatic hernia without obstruction or                            gangrene                           K92.0, Hematemesis CPT copyright 2016 American Medical Association. All rights reserved. The codes documented in this report are preliminary and upon coder review may  be revised to meet current compliance requirements. Rachael Feeaniel P Daysen Gundrum, MD 04/19/2017 11:28:11 AM This report has been signed electronically. Number of Addenda: 0

## 2017-04-19 NOTE — Care Management Obs Status (Signed)
MEDICARE OBSERVATION STATUS NOTIFICATION   Patient Details  Name: [name] [name] MRN: [id number] Date of Birth: Jul 17, 1924   Medicare Observation Status Notification Given:  Yes    Elliot CousinShavis, Eran Mistry Ellen, RN 04/19/2017, 2:58 PM

## 2021-09-02 ENCOUNTER — Emergency Department (HOSPITAL_COMMUNITY)
Admission: EM | Admit: 2021-09-02 | Discharge: 2021-09-03 | Disposition: A | Discharge status: Skilled Nursing Facility | Payer: Medicare PPO | Attending: Emergency Medicine | Admitting: Emergency Medicine

## 2021-09-02 ENCOUNTER — Other Ambulatory Visit: Payer: Self-pay

## 2021-09-02 ENCOUNTER — Emergency Department (HOSPITAL_COMMUNITY): Payer: Medicare PPO

## 2021-09-02 DIAGNOSIS — W1830XA Fall on same level, unspecified, initial encounter: Secondary | ICD-10-CM

## 2021-09-02 DIAGNOSIS — I10 Essential (primary) hypertension: Secondary | ICD-10-CM | POA: Diagnosis not present

## 2021-09-02 DIAGNOSIS — Z950 Presence of cardiac pacemaker: Secondary | ICD-10-CM | POA: Insufficient documentation

## 2021-09-02 DIAGNOSIS — E039 Hypothyroidism, unspecified: Secondary | ICD-10-CM | POA: Insufficient documentation

## 2021-09-02 DIAGNOSIS — W010XXA Fall on same level from slipping, tripping and stumbling without subsequent striking against object, initial encounter: Secondary | ICD-10-CM | POA: Insufficient documentation

## 2021-09-02 DIAGNOSIS — S0990XA Unspecified injury of head, initial encounter: Secondary | ICD-10-CM | POA: Diagnosis present

## 2021-09-02 DIAGNOSIS — F0390 Unspecified dementia without behavioral disturbance: Secondary | ICD-10-CM | POA: Insufficient documentation

## 2021-09-02 NOTE — ED Notes (Signed)
Pt Refused staff to assist her undressed & into a gown, EDP at bedside & aware.

## 2021-09-02 NOTE — Progress Notes (Signed)
Orthopedic Tech Progress Note Patient Details:  [name] [name] 12/01/1873 [id number]  Patient ID: [name] [name], female   DOB: 12/01/1873, 87 y.o.   MRN: [id number]  [name] 09/02/2021, 6:22 PM

## 2021-09-02 NOTE — Progress Notes (Signed)
   09/02/21 1756  Clinical Encounter Type  Visited With Patient not available  Visit Type Trauma  Referral From Nurse  Consult/Referral To Chaplain   Chaplain responded to page. Spoke with Licensed conveyancer to acknowledge receiving the page. Advise the unit secretary to call if chaplain is needed and that the chaplain remains available for follow-up, spiritual, emotional support as needed.This note was prepared by Deneen Harts, M.Div..  For questions please contact by phone [phone].

## 2021-09-02 NOTE — ED Provider Notes (Signed)
[facility] EMERGENCY DEPARTMENT Provider Note   CSN: [id number] Arrival date & time: 09/02/21  1814     History Chief Complaint  Patient presents with   Level 2   Fall thinners     [name] [name] is a 87 y.o. female with past medical history of cardiac pacemaker insertion, hypertension, hypothyroidism, dementia without behavioral disturbance, vitamin D deficiency, GERD, anemia, who presented via [facility] after she experienced a ground-level fall resulting in head trauma.  She states that she did not have any fainting with the episode, that she just accidentally fell.  She reports her only pain being over the area where she struck her head on left forehead.    Patient Active Problem List   Diagnosis Date Noted   Fall from ground level 09/02/2021    OB History   No obstetric history on file.     No family history on file.     Home Medications Prior to Admission medications   Not on File    Allergies    Patient has no allergy information on record.  Review of Systems   Review of Systems  Musculoskeletal:  Positive for gait problem (Patient is nonambulatory, uses wheelchair.). Negative for back pain and neck pain.  Neurological:  Positive for headaches. Negative for dizziness and syncope.   Physical Exam Updated Vital Signs BP 120/64   Pulse 71   Temp 97.7 F (36.5 C)   Resp 19   Ht 5\' 1"  (1.549 m)   Wt 74.8 kg   SpO2 96%   BMI 31.18 kg/m   Constitutional: Elderly woman resting in bed.  No acute distress noted. HENT: Small ecchymosis over left frontal/orbital ridge. Cardio: Regular rate and rhythm.  No murmurs, rubs, gallops. Pulm: Normal work of breathing on room air. Abdomen: Nondistended, nontender to palpation. MSK: Bilateral nonpitting edema to lower extremities.  Decreased bulk and tone overall.  No swelling, deformity noted.  No tenderness over cervical, thoracic, lumbar spine. Skin: Patient has 2 skin tears over right hand, 1  skin tear over left hand.  Negative for bruising to extremities, back. Neuro: Alert and oriented x3.  No focal deficit noted. Psych: Appropriate mood and affect.   ED Results / Procedures / Treatments   Labs (all labs ordered are listed, but only abnormal results are displayed) Labs Reviewed - No data to display  EKG None  Radiology CT Head Wo Contrast  Result Date: 09/02/2021 CLINICAL DATA:  Head trauma.  Fall. EXAM: CT HEAD WITHOUT CONTRAST CT CERVICAL SPINE WITHOUT CONTRAST TECHNIQUE: Multidetector CT imaging of the head and cervical spine was performed following the standard protocol without intravenous contrast. Multiplanar CT image reconstructions of the cervical spine were also generated. COMPARISON:  CT head and cervical spine 10/21/2017. FINDINGS: CT HEAD FINDINGS Brain: No evidence of acute infarction, hemorrhage, hydrocephalus, extra-axial collection or mass lesion/mass effect. There is mild diffuse atrophy. There is moderate patchy periventricular and deep white matter hypodensity, likely chronic small vessel ischemic change. Vascular: Atherosclerotic calcifications are present within the cavernous internal carotid arteries. Skull: Normal. Negative for fracture or focal lesion. Sinuses/Orbits: No acute finding. There is a small polyp or mucous retention cyst in the right maxillary sinus. Other: None. CT CERVICAL SPINE FINDINGS Alignment: Normal. Skull base and vertebrae: No acute fracture. No primary bone lesion or focal pathologic process. The bones are osteopenic. Soft tissues and spinal canal: No prevertebral fluid or swelling. No visible canal hematoma. Disc levels: There is disc space narrowing  throughout the cervical spine compatible with degenerative change. Bilateral facet arthropathy is present. There is moderate right-sided neural foraminal stenosis at C3-C4 and C4-C5 compatible with degenerative change. There is no severe central canal stenosis at any level. Upper chest: There  are atherosclerotic calcifications of the aorta. Other: None. IMPRESSION: 1.  No acute intracranial process. 2. No acute fracture or traumatic subluxation of the cervical spine. 3. Mild diffuse atrophy. Moderate chronic small vessel ischemic change. Electronically Signed   By: Darliss Cheney M.D.   On: 09/02/2021 20:06   CT Cervical Spine Wo Contrast  Result Date: 09/02/2021 CLINICAL DATA:  Head trauma.  Fall. EXAM: CT HEAD WITHOUT CONTRAST CT CERVICAL SPINE WITHOUT CONTRAST TECHNIQUE: Multidetector CT imaging of the head and cervical spine was performed following the standard protocol without intravenous contrast. Multiplanar CT image reconstructions of the cervical spine were also generated. COMPARISON:  CT head and cervical spine 10/21/2017. FINDINGS: CT HEAD FINDINGS Brain: No evidence of acute infarction, hemorrhage, hydrocephalus, extra-axial collection or mass lesion/mass effect. There is mild diffuse atrophy. There is moderate patchy periventricular and deep white matter hypodensity, likely chronic small vessel ischemic change. Vascular: Atherosclerotic calcifications are present within the cavernous internal carotid arteries. Skull: Normal. Negative for fracture or focal lesion. Sinuses/Orbits: No acute finding. There is a small polyp or mucous retention cyst in the right maxillary sinus. Other: None. CT CERVICAL SPINE FINDINGS Alignment: Normal. Skull base and vertebrae: No acute fracture. No primary bone lesion or focal pathologic process. The bones are osteopenic. Soft tissues and spinal canal: No prevertebral fluid or swelling. No visible canal hematoma. Disc levels: There is disc space narrowing throughout the cervical spine compatible with degenerative change. Bilateral facet arthropathy is present. There is moderate right-sided neural foraminal stenosis at C3-C4 and C4-C5 compatible with degenerative change. There is no severe central canal stenosis at any level. Upper chest: There are  atherosclerotic calcifications of the aorta. Other: None. IMPRESSION: 1.  No acute intracranial process. 2. No acute fracture or traumatic subluxation of the cervical spine. 3. Mild diffuse atrophy. Moderate chronic small vessel ischemic change. Electronically Signed   By: Darliss Cheney M.D.   On: 09/02/2021 20:06    Procedures None  Medications Ordered in ED Medications - No data to display  ED Course  I have reviewed the triage vital signs and the nursing notes.  Pertinent labs & imaging results that were available during my care of the patient were reviewed by me and considered in my medical decision making (see chart for details).    MDM Rules/Calculators/A&P                           [name] [name] presented after a ground-level fall on blood thinners where she struck her head.  On initial exam, patient was alert and oriented x4.  CT head and neck were completed to rule out acute intracranial processes.  These showed no acute intracranial process, no acute fracture or traumatic subluxation of cervical spine, mild diffuse atrophy and moderate chronic small vessel ischemic changes.  On reassessment, patient remains alert and oriented with no complaints aside from pain at the site where she struck her head.  Patient instructed to return to the ED for further evaluation if she develops headache, dizziness, weakness, syncope. Final Clinical Impression(s) / ED Diagnoses Final diagnoses:  Fall from ground level    Rx / DC Orders ED Discharge Orders     None  Champ Mungo, DO 09/02/21 2027    Milagros Loll, MD 09/02/21 2308

## 2021-09-02 NOTE — ED Triage Notes (Signed)
[facility] d/t fall on thinners. Pt reports falling forward while trying to pull her brief up after using the restroom. EMS reports pt has refused ALL care. The only v/s obtained while in route was 140/80, 95% O2 on RA, 80 bpm, she is at baseline orientation A/Ox4. Does use a w/c & is on Eliquis. Upon arrival to ED pt remains A/Ox4, verbal-able to make needs known, continues to refuse care.

## 2021-09-02 NOTE — Discharge Instructions (Addendum)
Return to ED for further evaluation if you develop headache, dizziness, weakness, syncope.

## 2021-09-03 NOTE — ED Notes (Signed)
Received verbal report from Alberto C RN at this time 

## 2023-07-23 IMAGING — CT CT CERVICAL SPINE W/O CM
3 series · 13 of 33 positions shown, 16 images · non-contrast
Comparison: CT head and cervical spine 10/21/2017.

CLINICAL DATA: Head trauma.  Fall.

EXAM:
CT HEAD WITHOUT CONTRAST
CT CERVICAL SPINE WITHOUT CONTRAST
TECHNIQUE: Multidetector CT imaging of the head and cervical spine was
performed following the standard protocol without intravenous
contrast. Multiplanar CT image reconstructions of the cervical spine
were also generated.

[Series 5: c_spine 2.0 st · axial · 0.46mm/px · z∈[+1152,+1268]mm · 5 of 84 slices shown, 7 images]
[im 13/84  soft-tissue]
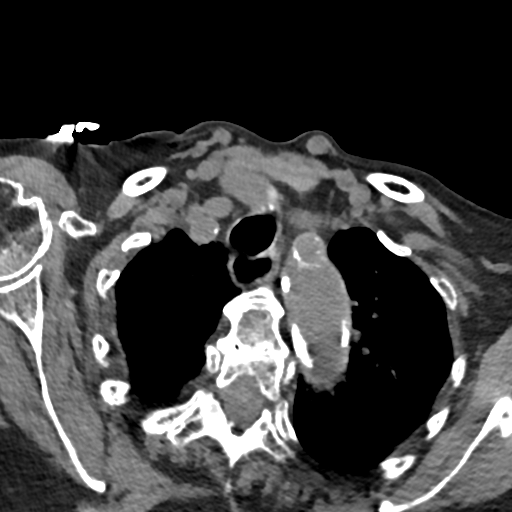
[im 13/84  bone]
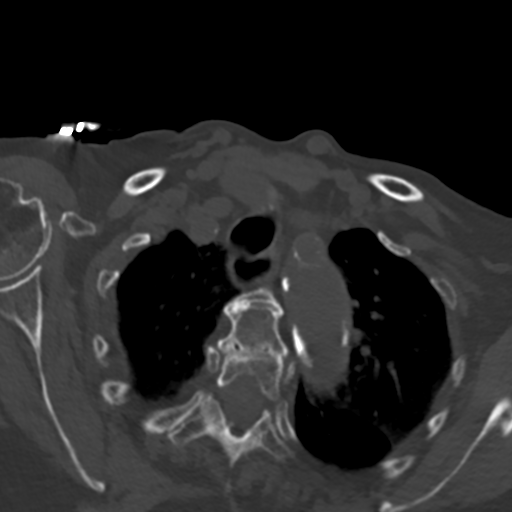
[im 26/84  bone]
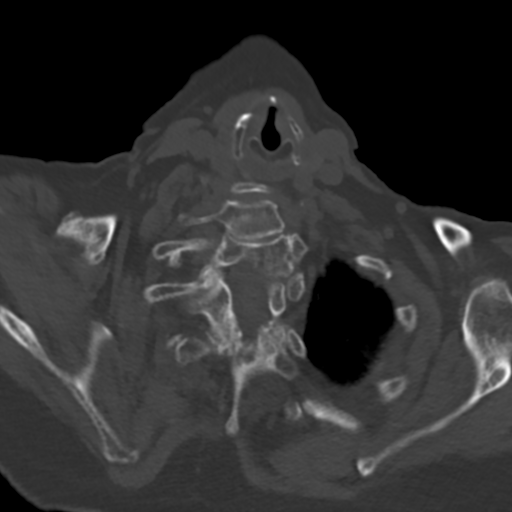
[im 45/84  bone]
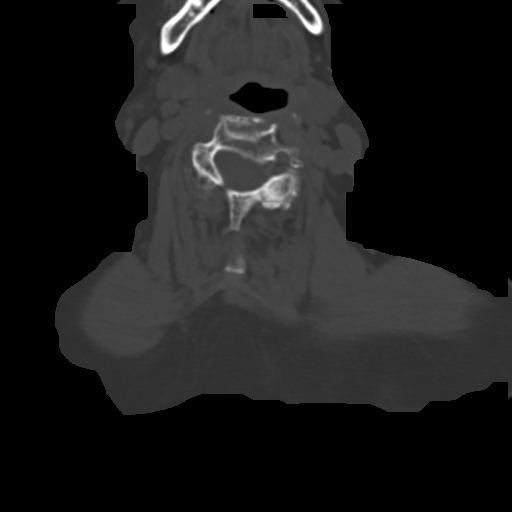
[im 58/84  bone]
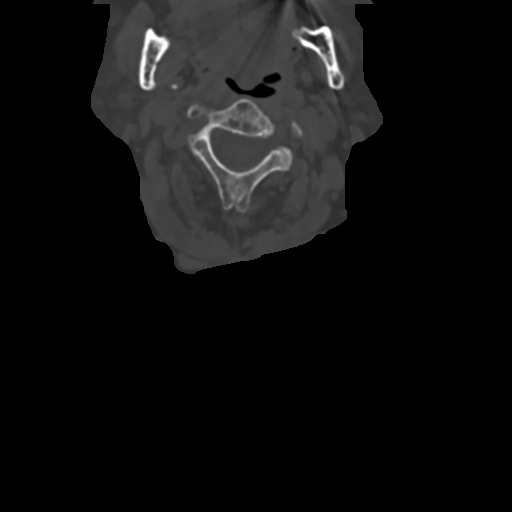
[im 71/84  soft-tissue]
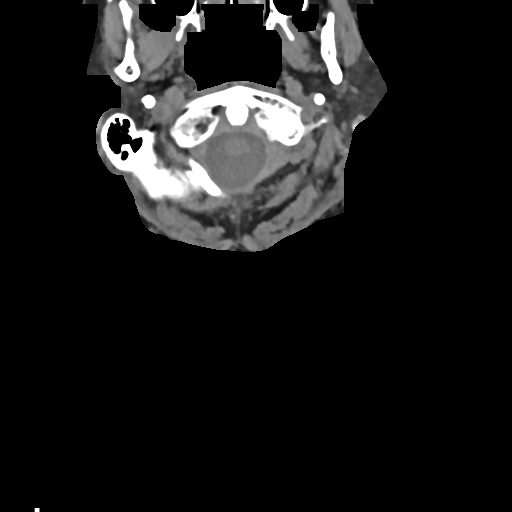
[im 71/84  bone]
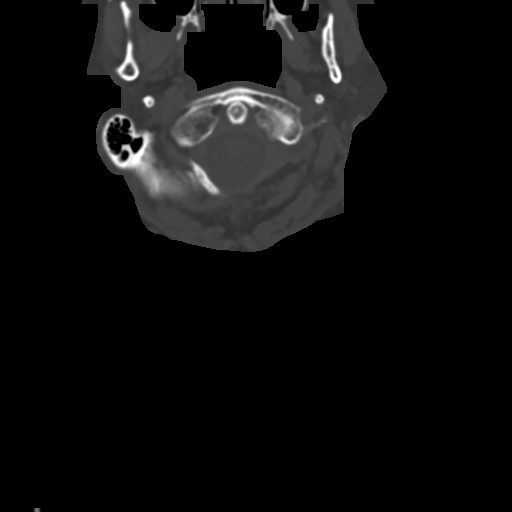

[Series 6: coronal bone · coronal · 0.31mm/px · 3 of 86 slices shown]
[im 25/86  bone]
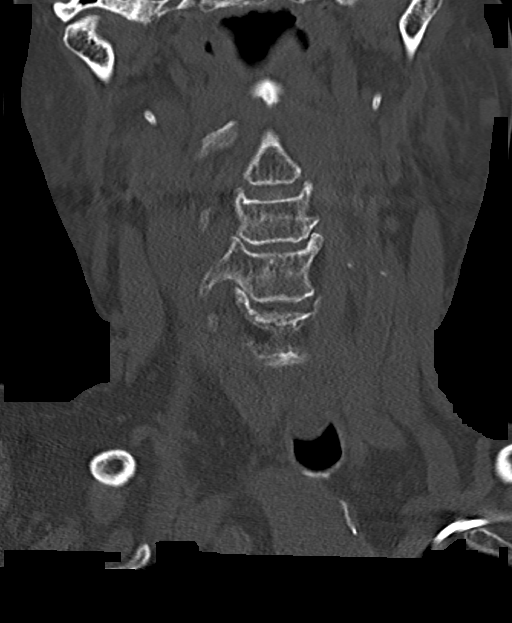
[im 37/86  bone]
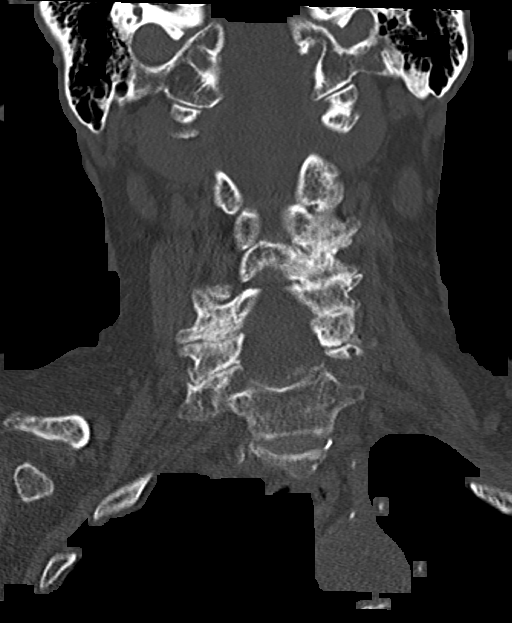
[im 49/86  bone]
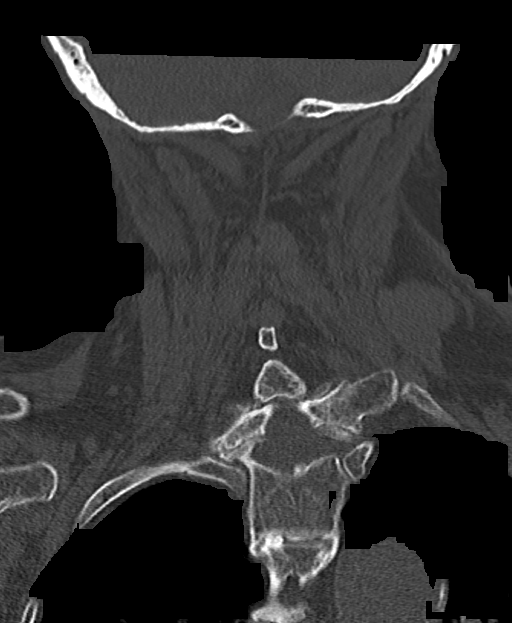

[Series 7: sagittal bone · sagittal · 0.32mm/px · 5 of 70 slices shown, 6 images]
[im 24/70  bone]
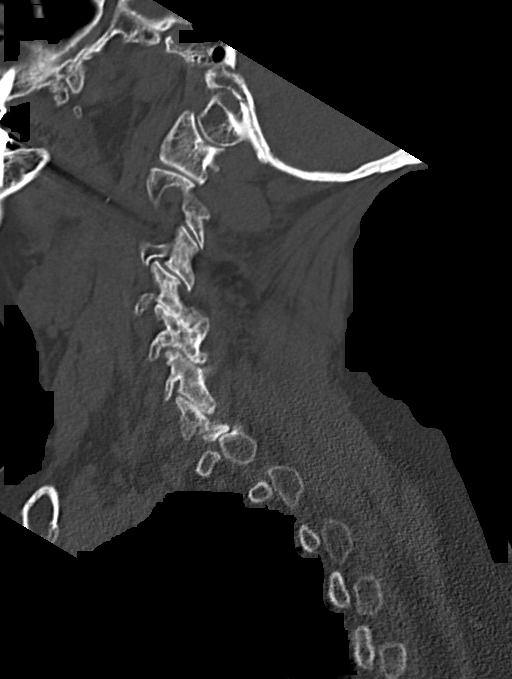
[im 29/70  bone]
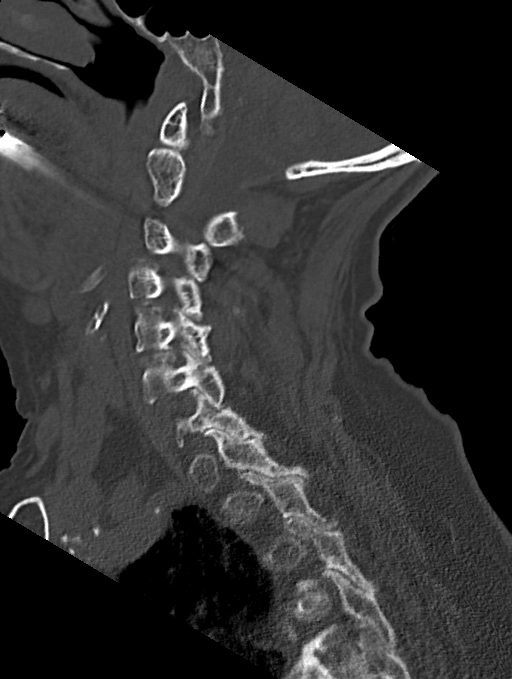
[im 35/70  soft-tissue]
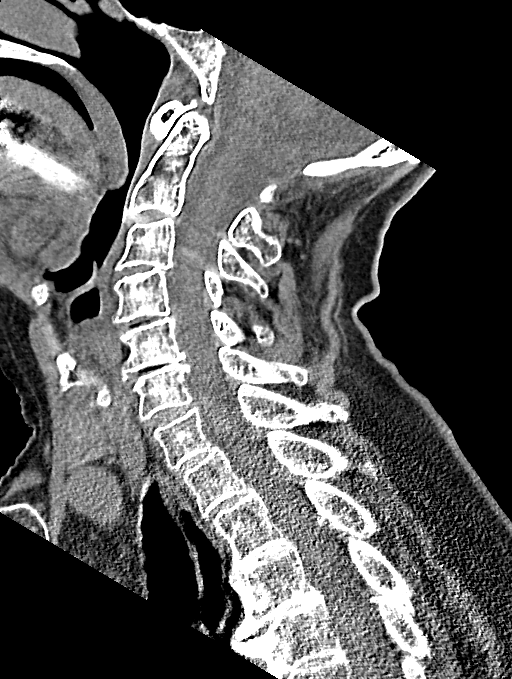
[im 35/70  bone]
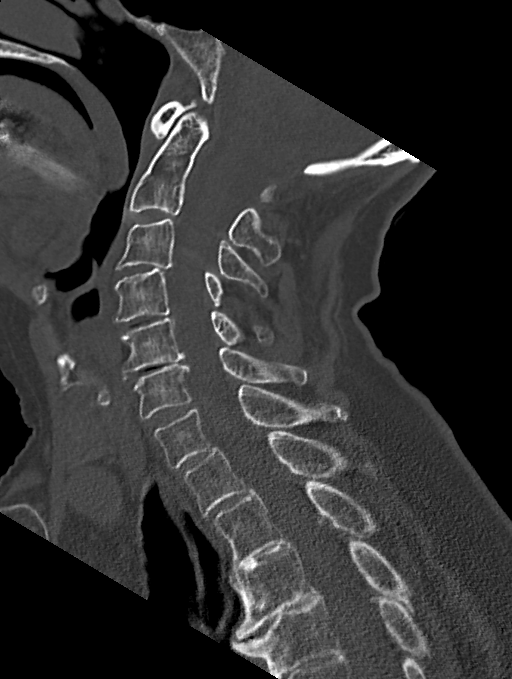
[im 41/70  bone]
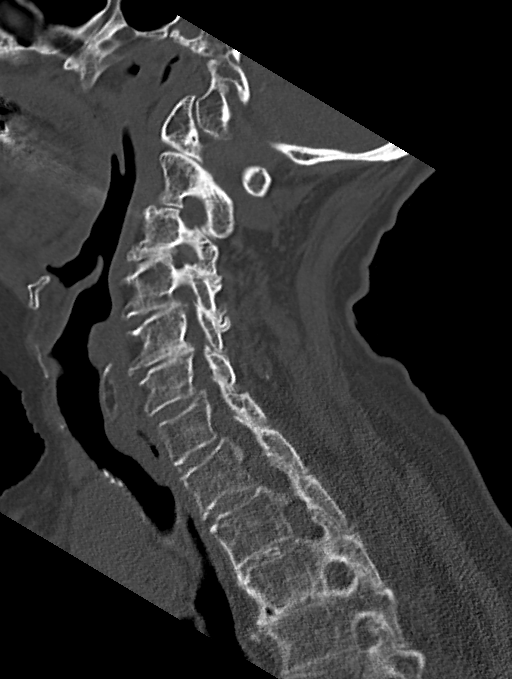
[im 47/70  bone]
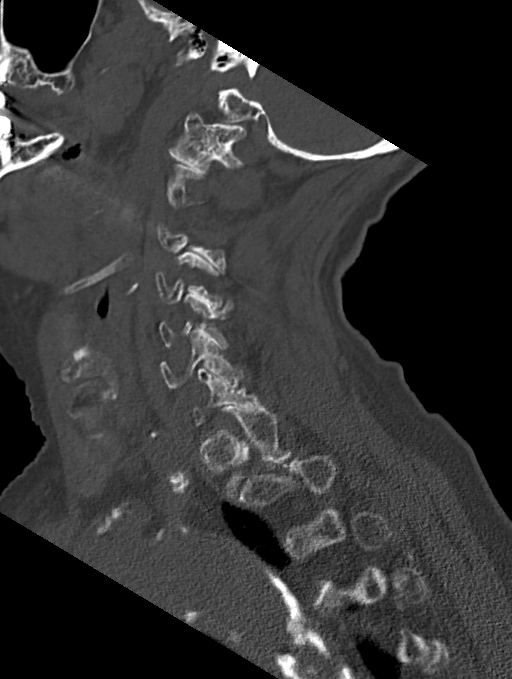

[13 of 33 positions shown; findings below may reference images not displayed]

FINDINGS: CT HEAD FINDINGS

Brain: No evidence of acute infarction, hemorrhage, hydrocephalus,
extra-axial collection or mass lesion/mass effect. There is mild
diffuse atrophy. There is moderate patchy periventricular and deep
white matter hypodensity, likely chronic small vessel ischemic
change.

Vascular: Atherosclerotic calcifications are present within the
cavernous internal carotid arteries.

Skull: Normal. Negative for fracture or focal lesion.

Sinuses/Orbits: No acute finding. There is a small polyp or mucous
retention cyst in the right maxillary sinus.

Other: None.

CT CERVICAL SPINE FINDINGS

Alignment: Normal.

Skull base and vertebrae: No acute fracture. No primary bone lesion
or focal pathologic process. The bones are osteopenic.

Soft tissues and spinal canal: No prevertebral fluid or swelling. No
visible canal hematoma.

Disc levels: There is disc space narrowing throughout the cervical
spine compatible with degenerative change. Bilateral facet
arthropathy is present. There is moderate right-sided neural
foraminal stenosis at C3-C4 and C4-C5 compatible with degenerative
change. There is no severe central canal stenosis at any level.

Upper chest: There are atherosclerotic calcifications of the aorta.

Other: None.
IMPRESSION: 1.  No acute intracranial process.

2. No acute fracture or traumatic subluxation of the cervical spine.

3. Mild diffuse atrophy. Moderate chronic small vessel ischemic
change.

## 2023-07-23 IMAGING — CT CT HEAD W/O CM
3 of 4 series · 15 of 47 positions shown, 18 images · non-contrast
Comparison: CT head and cervical spine 10/21/2017.

CLINICAL DATA: Head trauma.  Fall.

EXAM:
CT HEAD WITHOUT CONTRAST
CT CERVICAL SPINE WITHOUT CONTRAST
TECHNIQUE: Multidetector CT imaging of the head and cervical spine was
performed following the standard protocol without intravenous
contrast. Multiplanar CT image reconstructions of the cervical spine
were also generated.

[Series 3: head 2.0 h70h · axial · 0.46mm/px · z∈[+1276,+1406]mm · 9 of 83 slices shown, 12 images]
[im 9/83  brain]
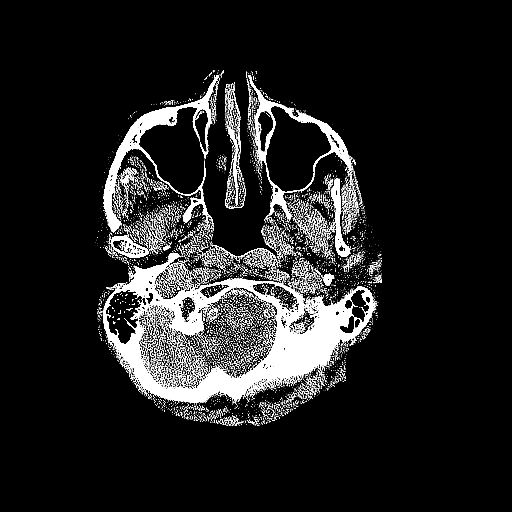
[im 9/83  bone]
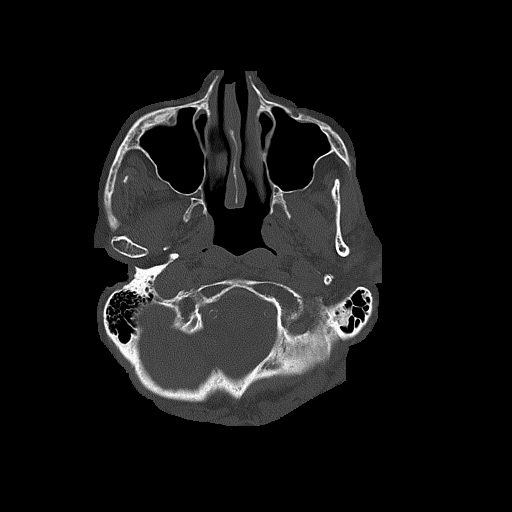
[im 17/83  brain]
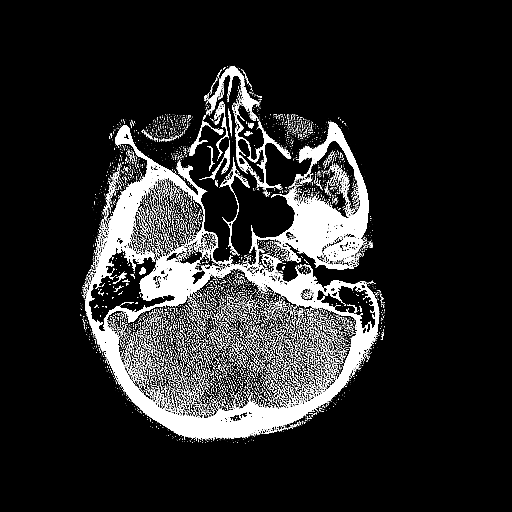
[im 25/83  brain]
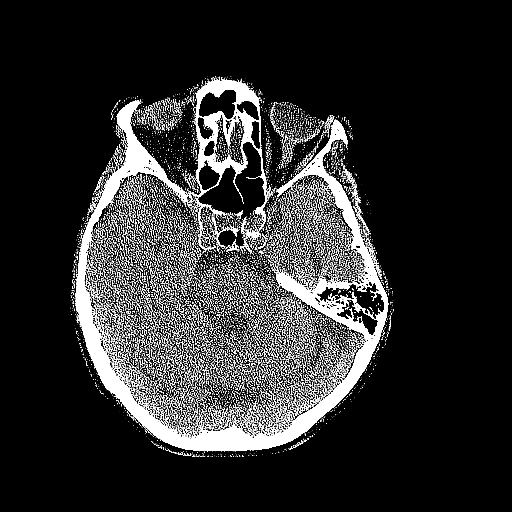
[im 33/83  brain]
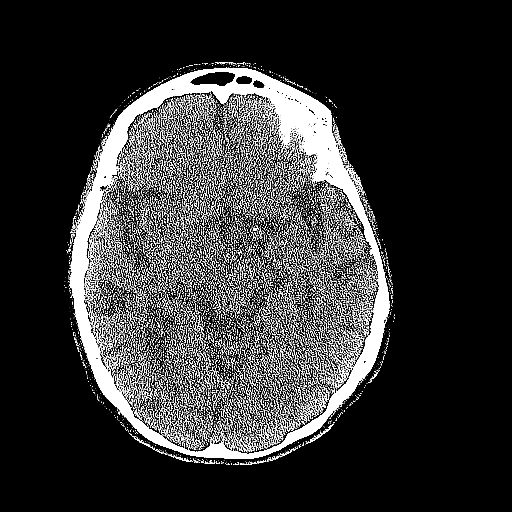
[im 42/83  brain]
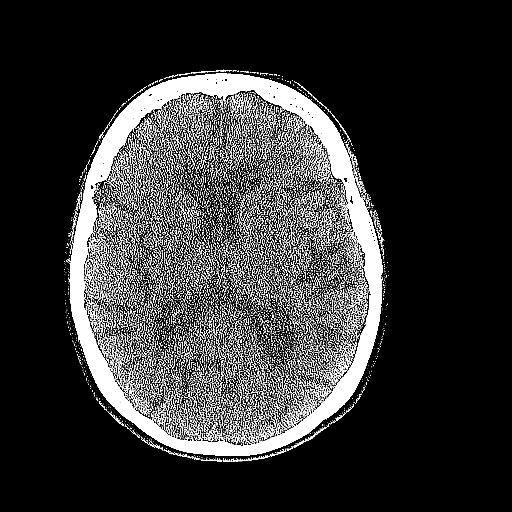
[im 42/83  bone]
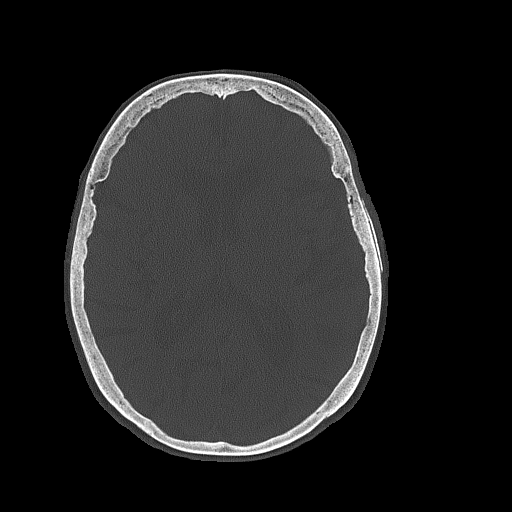
[im 50/83  brain]
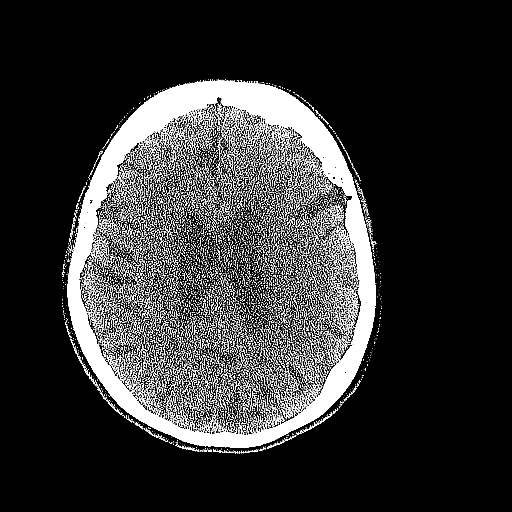
[im 58/83  brain]
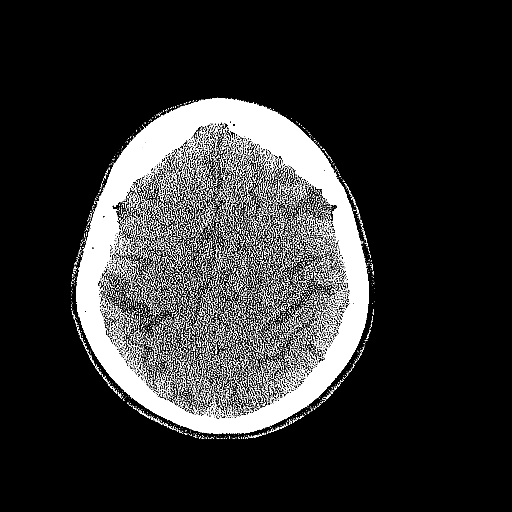
[im 66/83  brain]
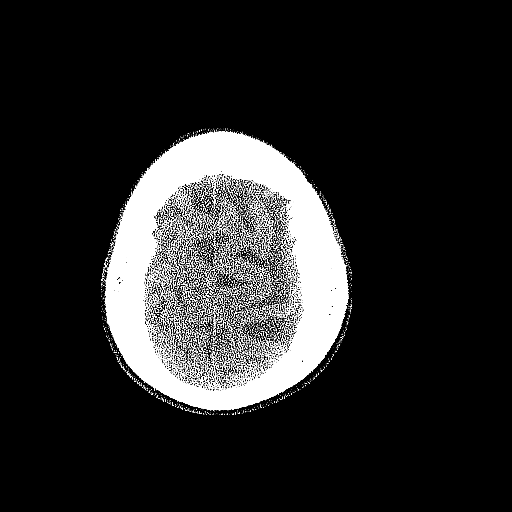
[im 74/83  brain]
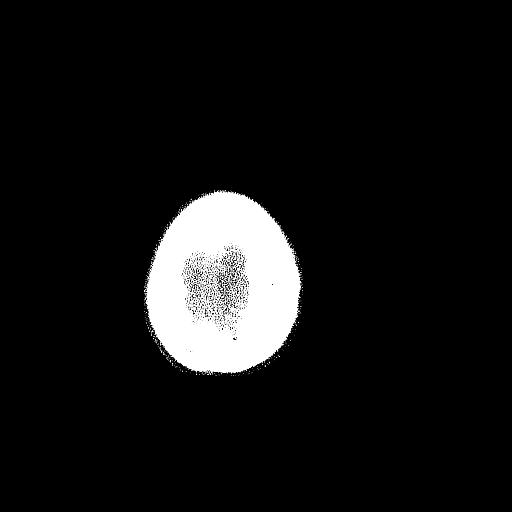
[im 74/83  bone]
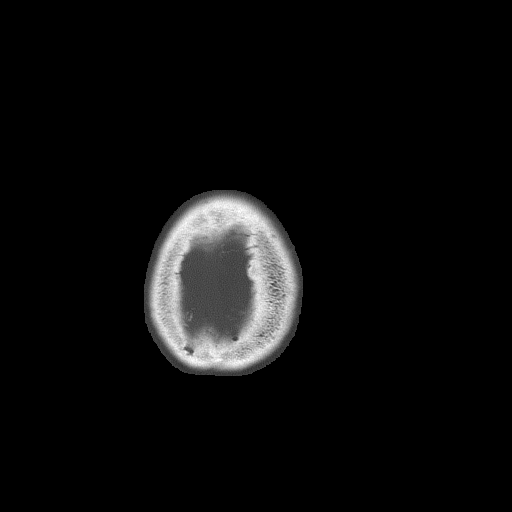

[Series 5: head 3.0 mpr cor · coronal · 0.33mm/px · 3 of 65 slices shown]
[im 22/65  brain]
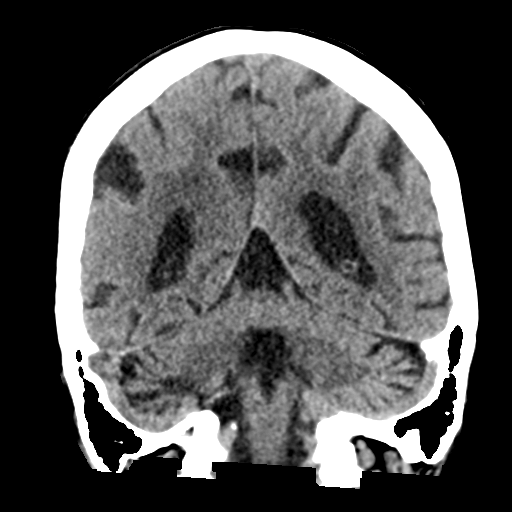
[im 29/65  brain]
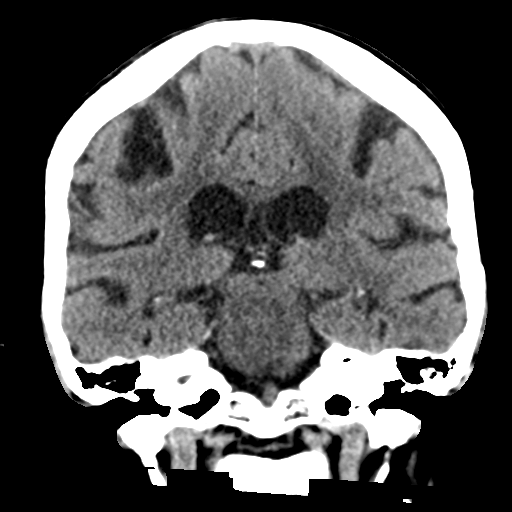
[im 36/65  brain]
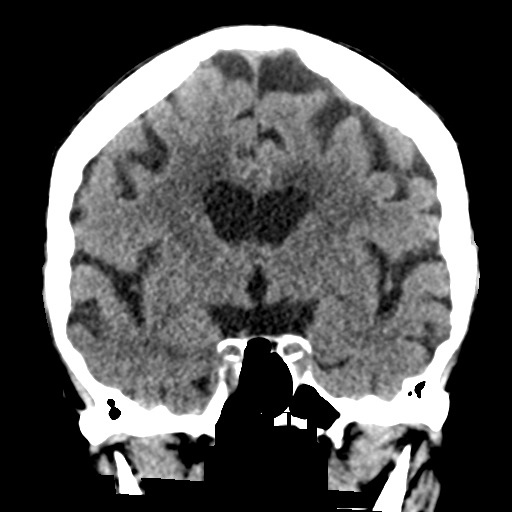

[Series 6: head 3.0 mpr sag · sagittal · 0.33mm/px · 3 of 53 slices shown]
[im 18/53  brain]
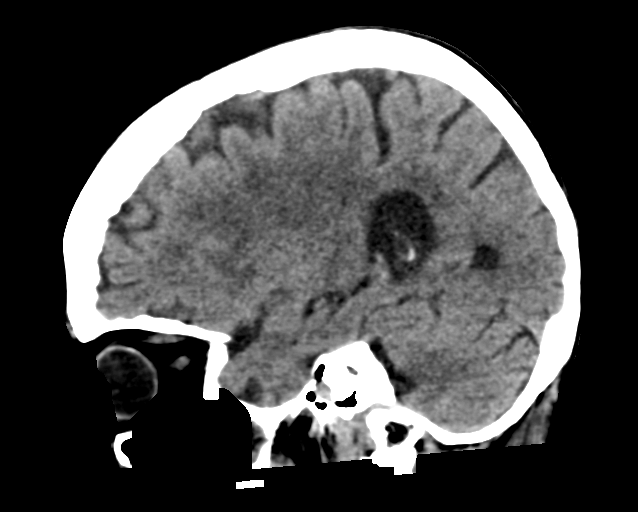
[im 27/53  brain]
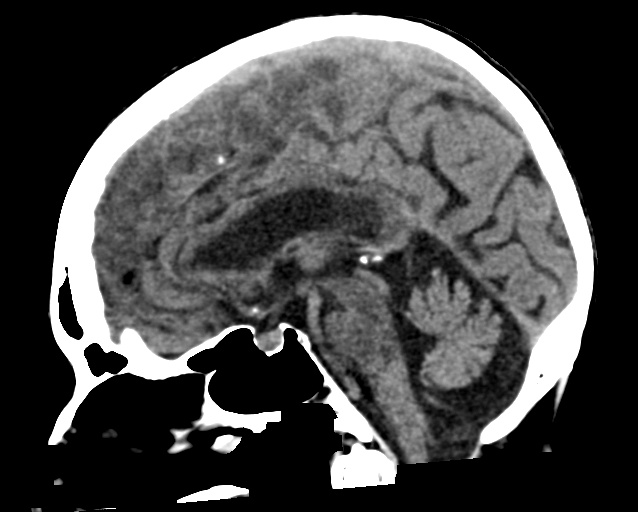
[im 35/53  brain]
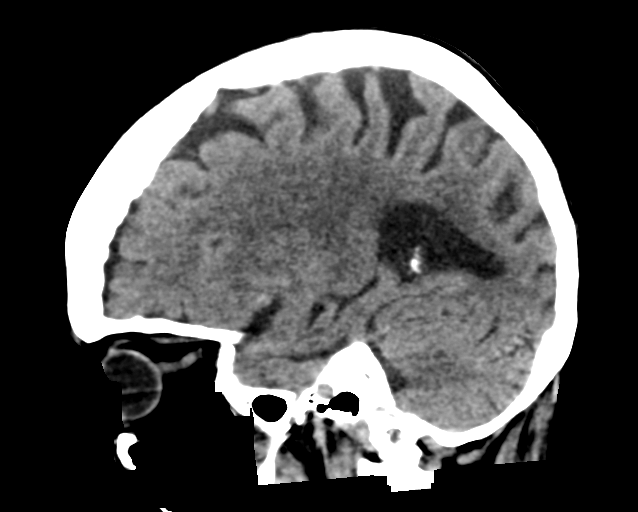

[15 of 47 positions shown; findings below may reference images not displayed]

FINDINGS: CT HEAD FINDINGS

Brain: No evidence of acute infarction, hemorrhage, hydrocephalus,
extra-axial collection or mass lesion/mass effect. There is mild
diffuse atrophy. There is moderate patchy periventricular and deep
white matter hypodensity, likely chronic small vessel ischemic
change.

Vascular: Atherosclerotic calcifications are present within the
cavernous internal carotid arteries.

Skull: Normal. Negative for fracture or focal lesion.

Sinuses/Orbits: No acute finding. There is a small polyp or mucous
retention cyst in the right maxillary sinus.

Other: None.

CT CERVICAL SPINE FINDINGS

Alignment: Normal.

Skull base and vertebrae: No acute fracture. No primary bone lesion
or focal pathologic process. The bones are osteopenic.

Soft tissues and spinal canal: No prevertebral fluid or swelling. No
visible canal hematoma.

Disc levels: There is disc space narrowing throughout the cervical
spine compatible with degenerative change. Bilateral facet
arthropathy is present. There is moderate right-sided neural
foraminal stenosis at C3-C4 and C4-C5 compatible with degenerative
change. There is no severe central canal stenosis at any level.

Upper chest: There are atherosclerotic calcifications of the aorta.

Other: None.
IMPRESSION: 1.  No acute intracranial process.

2. No acute fracture or traumatic subluxation of the cervical spine.

3. Mild diffuse atrophy. Moderate chronic small vessel ischemic
change.

## 2023-12-07 ENCOUNTER — Emergency Department (HOSPITAL_COMMUNITY): Payer: Medicare PPO

## 2023-12-07 ENCOUNTER — Other Ambulatory Visit: Payer: Self-pay

## 2023-12-07 ENCOUNTER — Inpatient Hospital Stay (HOSPITAL_COMMUNITY)
Admission: EM | Admit: 2023-12-07 | Discharge: 2023-12-09 | DRG: 369 | Disposition: A | Discharge status: Skilled Nursing Facility | Payer: Medicare PPO | Source: Skilled Nursing Facility | Attending: Internal Medicine | Admitting: Internal Medicine

## 2023-12-07 DIAGNOSIS — I959 Hypotension, unspecified: Secondary | ICD-10-CM | POA: Diagnosis present

## 2023-12-07 DIAGNOSIS — E46 Unspecified protein-calorie malnutrition: Secondary | ICD-10-CM | POA: Diagnosis present

## 2023-12-07 DIAGNOSIS — Z6831 Body mass index (BMI) 31.0-31.9, adult: Secondary | ICD-10-CM

## 2023-12-07 DIAGNOSIS — D6489 Other specified anemias: Secondary | ICD-10-CM

## 2023-12-07 DIAGNOSIS — Z950 Presence of cardiac pacemaker: Secondary | ICD-10-CM

## 2023-12-07 DIAGNOSIS — K920 Hematemesis: Secondary | ICD-10-CM | POA: Diagnosis not present

## 2023-12-07 DIAGNOSIS — I480 Paroxysmal atrial fibrillation: Secondary | ICD-10-CM | POA: Diagnosis present

## 2023-12-07 DIAGNOSIS — D6832 Hemorrhagic disorder due to extrinsic circulating anticoagulants: Secondary | ICD-10-CM | POA: Diagnosis present

## 2023-12-07 DIAGNOSIS — I442 Atrioventricular block, complete: Secondary | ICD-10-CM | POA: Diagnosis present

## 2023-12-07 DIAGNOSIS — Z79890 Hormone replacement therapy (postmenopausal): Secondary | ICD-10-CM

## 2023-12-07 DIAGNOSIS — E785 Hyperlipidemia, unspecified: Secondary | ICD-10-CM | POA: Diagnosis present

## 2023-12-07 DIAGNOSIS — K2101 Gastro-esophageal reflux disease with esophagitis, with bleeding: Secondary | ICD-10-CM | POA: Diagnosis not present

## 2023-12-07 DIAGNOSIS — Z7901 Long term (current) use of anticoagulants: Secondary | ICD-10-CM

## 2023-12-07 DIAGNOSIS — E441 Mild protein-calorie malnutrition: Secondary | ICD-10-CM | POA: Diagnosis present

## 2023-12-07 DIAGNOSIS — I10 Essential (primary) hypertension: Secondary | ICD-10-CM | POA: Diagnosis present

## 2023-12-07 DIAGNOSIS — K449 Diaphragmatic hernia without obstruction or gangrene: Secondary | ICD-10-CM | POA: Diagnosis present

## 2023-12-07 DIAGNOSIS — K922 Gastrointestinal hemorrhage, unspecified: Principal | ICD-10-CM

## 2023-12-07 DIAGNOSIS — D62 Acute posthemorrhagic anemia: Secondary | ICD-10-CM | POA: Diagnosis present

## 2023-12-07 DIAGNOSIS — Z79899 Other long term (current) drug therapy: Secondary | ICD-10-CM

## 2023-12-07 DIAGNOSIS — H9190 Unspecified hearing loss, unspecified ear: Secondary | ICD-10-CM | POA: Diagnosis present

## 2023-12-07 DIAGNOSIS — T45515A Adverse effect of anticoagulants, initial encounter: Secondary | ICD-10-CM | POA: Diagnosis present

## 2023-12-07 DIAGNOSIS — Z66 Do not resuscitate: Secondary | ICD-10-CM | POA: Diagnosis present

## 2023-12-07 DIAGNOSIS — K2100 Gastro-esophageal reflux disease with esophagitis, without bleeding: Secondary | ICD-10-CM | POA: Diagnosis present

## 2023-12-07 DIAGNOSIS — Z1152 Encounter for screening for COVID-19: Secondary | ICD-10-CM

## 2023-12-07 DIAGNOSIS — E039 Hypothyroidism, unspecified: Secondary | ICD-10-CM | POA: Diagnosis present

## 2023-12-07 LAB — RESP PANEL BY RT-PCR (RSV, FLU A&B, COVID)  RVPGX2
Influenza A by PCR: NEGATIVE
Influenza B by PCR: NEGATIVE
Resp Syncytial Virus by PCR: NEGATIVE
SARS Coronavirus 2 by RT PCR: NEGATIVE

## 2023-12-07 LAB — CBC WITH DIFFERENTIAL/PLATELET
Abs Immature Granulocytes: 0.04 10*3/uL (ref 0.00–0.07)
Basophils Absolute: 0 10*3/uL (ref 0.0–0.1)
Basophils Relative: 0 %
Eosinophils Absolute: 0 10*3/uL (ref 0.0–0.5)
Eosinophils Relative: 0 %
HCT: 28.5 % — ABNORMAL LOW (ref 36.0–46.0)
Hemoglobin: 8.6 g/dL — ABNORMAL LOW (ref 12.0–15.0)
Immature Granulocytes: 1 %
Lymphocytes Relative: 10 %
Lymphs Abs: 0.8 10*3/uL (ref 0.7–4.0)
MCH: 30.1 pg (ref 26.0–34.0)
MCHC: 30.2 g/dL (ref 30.0–36.0)
MCV: 99.7 fL (ref 80.0–100.0)
Monocytes Absolute: 0.6 10*3/uL (ref 0.1–1.0)
Monocytes Relative: 7 %
Neutro Abs: 6.5 10*3/uL (ref 1.7–7.7)
Neutrophils Relative %: 82 %
Platelets: 209 10*3/uL (ref 150–400)
RBC: 2.86 MIL/uL — ABNORMAL LOW (ref 3.87–5.11)
RDW: 15 % (ref 11.5–15.5)
WBC: 7.9 10*3/uL (ref 4.0–10.5)
nRBC: 0 % (ref 0.0–0.2)

## 2023-12-07 LAB — COMPREHENSIVE METABOLIC PANEL
ALT: 17 U/L (ref 0–44)
AST: 20 U/L (ref 15–41)
Albumin: 2.7 g/dL — ABNORMAL LOW (ref 3.5–5.0)
Alkaline Phosphatase: 55 U/L (ref 38–126)
Anion gap: 8 (ref 5–15)
BUN: 53 mg/dL — ABNORMAL HIGH (ref 8–23)
CO2: 20 mmol/L — ABNORMAL LOW (ref 22–32)
Calcium: 8.5 mg/dL — ABNORMAL LOW (ref 8.9–10.3)
Chloride: 107 mmol/L (ref 98–111)
Creatinine, Ser: 0.89 mg/dL (ref 0.44–1.00)
GFR, Estimated: 59 mL/min — ABNORMAL LOW (ref >60)
Glucose, Bld: 87 mg/dL (ref 70–99)
Potassium: 4.5 mmol/L (ref 3.5–5.1)
Sodium: 135 mmol/L (ref 135–145)
Total Bilirubin: 0.3 mg/dL (ref 0.0–1.2)
Total Protein: 5 g/dL — ABNORMAL LOW (ref 6.5–8.1)

## 2023-12-07 LAB — HEMOGLOBIN AND HEMATOCRIT, BLOOD
HCT: 23.3 % — ABNORMAL LOW (ref 36.0–46.0)
Hemoglobin: 6.9 g/dL — CL (ref 12.0–15.0)

## 2023-12-07 MED ORDER — IOHEXOL 300 MG/ML  SOLN
75.0000 mL | Freq: Once | INTRAMUSCULAR | Status: AC | PRN
  Administered 2023-12-07: 75 mL via INTRAVENOUS

## 2023-12-07 MED ORDER — PANTOPRAZOLE SODIUM 40 MG IV SOLR
40.0000 mg | Freq: Once | INTRAVENOUS | Status: AC
  Administered 2023-12-07: 40 mg via INTRAVENOUS
  Filled 2023-12-07: qty 10

## 2023-12-07 MED ORDER — ONDANSETRON HCL 4 MG/2ML IJ SOLN
4.0000 mg | Freq: Once | INTRAMUSCULAR | Status: AC
  Administered 2023-12-07: 4 mg via INTRAVENOUS
  Filled 2023-12-07: qty 2

## 2023-12-07 NOTE — ED Triage Notes (Signed)
 Patient brought in by EMS from [facility] at [facility] for coffe like emesis. Per EMS staff reported that she had 1 episode of bloody and dark emesis. Pt. Denies ABD pain or discomfort. EMS started 24g in L hand and gave 250-300cc of NS. 118/60 80 22 96% RA CBG: 153

## 2023-12-07 NOTE — ED Notes (Signed)
 Please call [place] patient niece for updates [phone]

## 2023-12-07 NOTE — ED Provider Notes (Addendum)
 [place] EMERGENCY DEPARTMENT AT [facility] Provider Note   CSN: [id number] Arrival date & time: 12/07/23  1519     History  Chief Complaint  Patient presents with   Hematemesis    Vicci Reder is a 89 y.o. female.  Patient is a 89 year old female with a history of hypertension, hyperlipidemia, prior GERD/esophagitis, anemia, complete heart block with a pacemaker who is currently on Eliquis who is presenting today from [facility] due to 1 episode of emesis that they were concerned could be bloody.  Patient reports that she is still feeling a little bit nauseated but denies any abdominal pain.  She denies any chest pain or shortness of breath.  The last dose of Eliquis was yesterday evening at 8 PM.  Patient is very hard of hearing and is difficult to get a history but she denied any prior history of GI bleeding.  EMS did report that patient was pale and slightly hypotensive when they got there but that responded to 250 mL of normal saline.  No documented Pepto-Bismol use.  The history is provided by the patient and medical records.       Home Medications Prior to Admission medications   Medication Sig Start Date End Date Taking? Authorizing Provider  amLODipine (NORVASC) 2.5 MG tablet Take 2.5 mg by mouth daily.   Yes [provider]  BAZA PROTECT MOISTURE BARRIER 12 % CREA Apply 1 application  topically See admin instructions. Apply ointment to perineum to prevent MASD 2/2 incontinence- every shift   Yes [provider]  Calcium Carbonate (CALTRATE 600 PO) Take 600 mg by mouth daily.   Yes [provider]  cyanocobalamin  (VITAMIN B12) 1000 MCG tablet Take 1,000 mcg by mouth every Monday, Wednesday, and Friday.   Yes [provider]  ELIQUIS 2.5 MG TABS tablet Take 2.5 mg by mouth See admin instructions. Take 2.5 mg by mouth with lunch and at bedtime   Yes [provider]  esomeprazole  (NEXIUM ) 40 MG capsule Take 1  capsule (40 mg total) by mouth 2 (two) times daily before a meal. Patient taking differently: Take 40 mg by mouth daily. 04/19/17  Yes Theophilus Andrews, Tully GRADE, MD  ferrous sulfate 325 (65 FE) MG tablet Take 325 mg by mouth See admin instructions. Take 325 mg by mouth every other day with food   Yes [provider]  levocetirizine (XYZAL) 5 MG tablet Take 5 mg by mouth at bedtime.   Yes [provider]  Levothyroxine  Sodium 44 MCG CAPS Take 44 mcg by mouth at bedtime.   Yes [provider]  lisinopril (ZESTRIL) 40 MG tablet Take 40 mg by mouth daily.   Yes [provider]  ondansetron  (ZOFRAN -ODT) 4 MG disintegrating tablet Take 4 mg by mouth every 6 (six) hours as needed for nausea or vomiting (dissolve orally).   Yes [provider]  Ostomy Supplies (SKIN PREP WIPES) MISC Apply 1 Pad topically See admin instructions. Per MAR: Apply 1 new wipe to left great toe every 12 hours as needed for comfort/protection for corn   Yes [provider]  sennosides-docusate sodium  (SENOKOT-S) 8.6-50 MG tablet Take 2 tablets by mouth daily.   Yes [provider]  Vitamin D, Ergocalciferol, (DRISDOL) 1.25 MG (50000 UNIT) CAPS capsule Take 50,000 Units by mouth every 30 (thirty) days.   Yes [provider]      Allergies    Patient has no known allergies.    Review of Systems  Review of Systems  Physical Exam Updated Vital Signs BP (!) 108/46   Pulse 87   Temp 98.2 F (36.8 C) (Oral)   Resp 19   Ht 5' 1 (1.549 m)   Wt 75 kg   SpO2 95%   BMI 31.24 kg/m  Physical Exam Vitals and nursing note reviewed.  Constitutional:      General: She is in acute distress.     Appearance: She is well-developed.  HENT:     Head: Normocephalic and atraumatic.     Mouth/Throat:     Comments: Very dark emesis dried around the mouth Eyes:     Pupils: Pupils are equal, round, and reactive to light.  Cardiovascular:     Rate and Rhythm:  Normal rate and regular rhythm.     Heart sounds: Murmur heard.     No friction rub.  Pulmonary:     Effort: Pulmonary effort is normal.     Breath sounds: Normal breath sounds. No wheezing or rales.  Abdominal:     General: Bowel sounds are normal. There is no distension.     Palpations: Abdomen is soft.     Tenderness: There is no abdominal tenderness. There is no guarding or rebound.  Musculoskeletal:        General: No tenderness. Normal range of motion.     Right lower leg: Edema present.     Left lower leg: Edema present.     Comments: 1+ pitting edema in bilateral lower extremities  Skin:    General: Skin is warm and dry.     Findings: No rash.  Neurological:     Mental Status: She is alert and oriented to person, place, and time.     Cranial Nerves: No cranial nerve deficit.     Sensory: No sensory deficit.     Motor: No weakness.  Psychiatric:        Behavior: Behavior normal.     ED Results / Procedures / Treatments   Labs (all labs ordered are listed, but only abnormal results are displayed) Labs Reviewed  CBC WITH DIFFERENTIAL/PLATELET - Abnormal; Notable for the following components:      Result Value   RBC 2.86 (*)    Hemoglobin 8.6 (*)    HCT 28.5 (*)    All other components within normal limits  COMPREHENSIVE METABOLIC PANEL - Abnormal; Notable for the following components:   CO2 20 (*)    BUN 53 (*)    Calcium 8.5 (*)    Total Protein 5.0 (*)    Albumin 2.7 (*)    GFR, Estimated 59 (*)    All other components within normal limits  HEMOGLOBIN AND HEMATOCRIT, BLOOD - Abnormal; Notable for the following components:   Hemoglobin 6.9 (*)    HCT 23.3 (*)    All other components within normal limits  RESP PANEL BY RT-PCR (RSV, FLU A&B, COVID)  [id number]  POC OCCULT BLOOD, ED  POC OCCULT BLOOD, ED  TYPE AND SCREEN  PREPARE RBC (CROSSMATCH)    EKG EKG Interpretation Date/Time:  Monday December 07 2023 16:21:32 EST Ventricular Rate:  74 PR  Interval:  199 QRS Duration:  151 QT Interval:  476 QTC Calculation: 529 R Axis:   -77  Text Interpretation: VENTRICULAR PACED RHYTHM Left bundle branch block Confirmed by Doretha Folks ([id number]) on 12/07/2023 5:46:08 PM  Radiology CT ABDOMEN PELVIS W CONTRAST Result Date: 12/07/2023 CLINICAL DATA:  Coffee-ground emesis. EXAM: CT ABDOMEN AND PELVIS WITH CONTRAST TECHNIQUE:  Multidetector CT imaging of the abdomen and pelvis was performed using the standard protocol following bolus administration of intravenous contrast. RADIATION DOSE REDUCTION: This exam was performed according to the departmental dose-optimization program which includes automated exposure control, adjustment of the mA and/or kV according to patient size and/or use of iterative reconstruction technique. CONTRAST:  75mL OMNIPAQUE  IOHEXOL  300 MG/ML  SOLN COMPARISON:  None Available. FINDINGS: Lower chest: Moderate sized hiatal hernia. Visualized distal esophagus is circumferentially thick walled and fluid-filled. Pacer wires in the right heart. Bibasilar opacities, favor atelectasis or scarring. No effusions. Hepatobiliary: Numerous cysts scattered throughout the liver. No suspicious focal hepatic abnormality. Prior cholecystectomy. No biliary ductal dilatation. Pancreas: No focal abnormality or ductal dilatation. Spleen: No focal abnormality.  Normal size. Adrenals/Urinary Tract: Numerous bilateral renal cysts. No follow-up imaging recommended. No stones or hydronephrosis. Adrenal glands and urinary bladder unremarkable. Stomach/Bowel: Left colonic diverticulosis. No active diverticulitis. Stomach and small bowel decompressed. No evidence of bowel obstruction. Duodenal diverticulum in the region of the pancreatic head. Vascular/Lymphatic: Aortoiliac atherosclerosis. No evidence of aneurysm or adenopathy. Reproductive: Uterus and adnexa unremarkable.  No mass. Other: No free fluid or free air. Musculoskeletal: No acute bony abnormality.  IMPRESSION: Moderate-sized hiatal hernia. Visualized distal esophagus is thick-walled and fluid-filled. This could be related to reflux and esophagitis. Left colonic diverticulosis. Aortoiliac atherosclerosis. Bibasilar atelectasis or scarring. Electronically Signed   By: Franky Crease M.D.   On: 12/07/2023 23:06   DG Chest Port 1 View Result Date: 12/07/2023 CLINICAL DATA:  Coffee-ground emesis EXAM: PORTABLE CHEST 1 VIEW COMPARISON:  None Available. FINDINGS: Left chest wall pacemaker leads project over the right atrium and ventricle. Low lung volumes. Patchy bibasilar opacities. Trace blunting of the bilateral costophrenic angles. No pneumothorax. The heart size and mediastinal contours are within normal limits. No acute osseous abnormality. IMPRESSION: 1. Low lung volumes with patchy bibasilar opacities, which may represent atelectasis, aspiration, or pneumonia. 2. Trace blunting of the bilateral costophrenic angles, which may represent trace pleural effusions. Electronically Signed   By: Limin  Xu M.D.   On: 12/07/2023 17:21    Procedures Procedures    Medications Ordered in ED Medications  0.9 %  sodium chloride  infusion (Manually program via Guardrails IV Fluids) (has no administration in time range)  ondansetron  (ZOFRAN ) injection 4 mg (4 mg Intravenous Given 12/07/23 1624)  pantoprazole  (PROTONIX ) injection 40 mg (40 mg Intravenous Given 12/07/23 1817)  iohexol  (OMNIPAQUE ) 300 MG/ML solution 75 mL (75 mLs Intravenous Contrast Given 12/07/23 2254)    ED Course/ Medical Decision Making/ A&P                                 Medical Decision Making Amount and/or Complexity of Data Reviewed External Data Reviewed: notes. Labs: ordered. Decision-making details documented in ED Course. Radiology: ordered and independent interpretation performed. Decision-making details documented in ED Course. ECG/medicine tests: ordered and independent interpretation performed. Decision-making details documented  in ED Course.  Risk Prescription drug management. Decision regarding hospitalization.   Pt with multiple medical problems and comorbidities and presenting today with a complaint that caries a high risk for morbidity and mortality.  Here today after 1 episode of emesis which was very dark.  Concern for upper GI bleed with her history of gastritis and esophagitis as well as being on anticoagulants for her complete heart block versus acute abdominal process versus pneumonia or early viral illness as patient does have a temperature  of 99.1 orally and does live in a group facility.  Currently vital signs are reassuring.  Patient is noted to be a DNR.  She is awake and alert and able to answer questions.  I independently interpreted patient's labs and EKG.  On CBC today white count is normal, hemoglobin is 8.6 from 10 but that it was in 2018 without other records available.  CMp with normal Cr but does have BUN of 53 and normal LfT's.  Covid is neg. I have independently visualized and interpreted pt's images today.  Ct showed no signs of obstruction or PNA. Radiology reports moderate sized hiatal hernia and distal esophagus with thick walled and fluid filled which may show esophagitis.  Cxr without acute findings.  Attempted to do a rectal exam but pt states she does not want that done and reports just wanting to go home.  Repeat CBC pending.  No further vomiting here.  12:18 AM Repeat hemoglobin here has dropped to 6.9.  2 units of blood were ordered for the patient.  She did consent to receive blood.  She is already saved Protonix  and again Eliquis was last taken at 8 PM yesterday.  Will admit patient for concern for upper GI bleeding.  Patient will be admitted to the hospital.  She remains hemodynamically stable.  CRITICAL CARE Performed by: Candiss Galeana Total critical care time: 30 minutes Critical care time was exclusive of separately billable procedures and treating other patients. Critical  care was necessary to treat or prevent imminent or life-threatening deterioration. Critical care was time spent personally by me on the following activities: development of treatment plan with patient and/or surrogate as well as nursing, discussions with consultants, evaluation of patient's response to treatment, examination of patient, obtaining history from patient or surrogate, ordering and performing treatments and interventions, ordering and review of laboratory studies, ordering and review of radiographic studies, pulse oximetry and re-evaluation of patient's condition.           Final Clinical Impression(s) / ED Diagnoses Final diagnoses:  Upper GI bleed  Anemia due to other cause, not classified    Rx / DC Orders ED Discharge Orders     None         Doretha Folks, MD 12/07/23 2352    Doretha Folks, MD 12/08/23 [phone]

## 2023-12-08 DIAGNOSIS — I480 Paroxysmal atrial fibrillation: Secondary | ICD-10-CM | POA: Diagnosis present

## 2023-12-08 DIAGNOSIS — K920 Hematemesis: Secondary | ICD-10-CM | POA: Diagnosis present

## 2023-12-08 DIAGNOSIS — D6832 Hemorrhagic disorder due to extrinsic circulating anticoagulants: Secondary | ICD-10-CM | POA: Diagnosis present

## 2023-12-08 DIAGNOSIS — K922 Gastrointestinal hemorrhage, unspecified: Principal | ICD-10-CM | POA: Diagnosis present

## 2023-12-08 DIAGNOSIS — T45515A Adverse effect of anticoagulants, initial encounter: Secondary | ICD-10-CM | POA: Diagnosis present

## 2023-12-08 DIAGNOSIS — Z7901 Long term (current) use of anticoagulants: Secondary | ICD-10-CM | POA: Diagnosis not present

## 2023-12-08 DIAGNOSIS — D6489 Other specified anemias: Secondary | ICD-10-CM | POA: Diagnosis not present

## 2023-12-08 DIAGNOSIS — Z79890 Hormone replacement therapy (postmenopausal): Secondary | ICD-10-CM | POA: Diagnosis not present

## 2023-12-08 DIAGNOSIS — E785 Hyperlipidemia, unspecified: Secondary | ICD-10-CM | POA: Diagnosis present

## 2023-12-08 DIAGNOSIS — K2101 Gastro-esophageal reflux disease with esophagitis, with bleeding: Principal | ICD-10-CM

## 2023-12-08 DIAGNOSIS — I959 Hypotension, unspecified: Secondary | ICD-10-CM | POA: Diagnosis present

## 2023-12-08 DIAGNOSIS — I442 Atrioventricular block, complete: Secondary | ICD-10-CM | POA: Diagnosis present

## 2023-12-08 DIAGNOSIS — I10 Essential (primary) hypertension: Secondary | ICD-10-CM | POA: Diagnosis present

## 2023-12-08 DIAGNOSIS — E039 Hypothyroidism, unspecified: Secondary | ICD-10-CM | POA: Diagnosis present

## 2023-12-08 DIAGNOSIS — K2100 Gastro-esophageal reflux disease with esophagitis, without bleeding: Secondary | ICD-10-CM | POA: Diagnosis present

## 2023-12-08 DIAGNOSIS — Z1152 Encounter for screening for COVID-19: Secondary | ICD-10-CM | POA: Diagnosis not present

## 2023-12-08 DIAGNOSIS — Z66 Do not resuscitate: Secondary | ICD-10-CM | POA: Diagnosis present

## 2023-12-08 DIAGNOSIS — Z79899 Other long term (current) drug therapy: Secondary | ICD-10-CM | POA: Diagnosis not present

## 2023-12-08 DIAGNOSIS — E441 Mild protein-calorie malnutrition: Secondary | ICD-10-CM | POA: Diagnosis present

## 2023-12-08 DIAGNOSIS — E46 Unspecified protein-calorie malnutrition: Secondary | ICD-10-CM | POA: Diagnosis present

## 2023-12-08 DIAGNOSIS — H9190 Unspecified hearing loss, unspecified ear: Secondary | ICD-10-CM | POA: Diagnosis present

## 2023-12-08 DIAGNOSIS — Z950 Presence of cardiac pacemaker: Secondary | ICD-10-CM | POA: Diagnosis not present

## 2023-12-08 DIAGNOSIS — D62 Acute posthemorrhagic anemia: Secondary | ICD-10-CM | POA: Diagnosis present

## 2023-12-08 DIAGNOSIS — K449 Diaphragmatic hernia without obstruction or gangrene: Secondary | ICD-10-CM | POA: Diagnosis present

## 2023-12-08 DIAGNOSIS — Z6831 Body mass index (BMI) 31.0-31.9, adult: Secondary | ICD-10-CM | POA: Diagnosis not present

## 2023-12-08 LAB — PREPARE RBC (CROSSMATCH)

## 2023-12-08 LAB — HEMOGLOBIN AND HEMATOCRIT, BLOOD
HCT: 31.7 % — ABNORMAL LOW (ref 36.0–46.0)
Hemoglobin: 9.5 g/dL — ABNORMAL LOW (ref 12.0–15.0)

## 2023-12-08 MED ORDER — SUCRALFATE 1 GM/10ML PO SUSP
1.0000 g | Freq: Three times a day (TID) | ORAL | Status: DC
  Administered 2023-12-08 – 2023-12-09 (×4): 1 g via ORAL
  Filled 2023-12-08 (×4): qty 10

## 2023-12-08 MED ORDER — PANTOPRAZOLE SODIUM 40 MG IV SOLR
40.0000 mg | Freq: Two times a day (BID) | INTRAVENOUS | Status: DC
  Administered 2023-12-08 – 2023-12-09 (×3): 40 mg via INTRAVENOUS
  Filled 2023-12-08 (×3): qty 10

## 2023-12-08 MED ORDER — SODIUM CHLORIDE 0.9% IV SOLUTION: Freq: Once | INTRAVENOUS | Status: AC

## 2023-12-08 NOTE — ED Notes (Signed)
 Patient's niece [place] updated via phone on pt status, that she is currently stable and H&H is improving

## 2023-12-08 NOTE — ED Notes (Signed)
 Pt stated she wanted to go home and was not compliant with drawing blood from IV.  Pt stated she would "slap me" if I did not stop.

## 2023-12-08 NOTE — Consult Note (Signed)
 [facility] Consultation Note  Referring Provider: [facility] Primary Care Physician:  Melchor Ozell PARAS, MD Primary Gastroenterologist:  [place]  Reason for Consultation:  hematemesis  HPI: [name] [name] is a 89 y.o. female presenting for reports of coffee ground emesis at [facility].  No witnessed bleeding thus far in ED.  On apixaban.  Similar presentation in 2018 with endoscopy done, results below.  Denies abdominal pain.   Past Medical History:  Diagnosis Date   Thyroid  disease     Past Surgical History:  Procedure Laterality Date   APPENDECTOMY     ESOPHAGOGASTRODUODENOSCOPY N/A 04/19/2017   Procedure: ESOPHAGOGASTRODUODENOSCOPY (EGD);  Surgeon: Teressa Toribio SQUIBB, MD;  Location: [facility];  Service: Endoscopy;  Laterality: N/A;    Prior to Admission medications   Medication Sig Start Date End Date Taking? Authorizing Provider  amLODipine (NORVASC) 2.5 MG tablet Take 2.5 mg by mouth daily.   Yes [provider]  BAZA PROTECT MOISTURE BARRIER 12 % CREA Apply 1 application  topically See admin instructions. Apply ointment to perineum to prevent MASD 2/2 incontinence- every shift   Yes [provider]  Calcium Carbonate (CALTRATE 600 PO) Take 600 mg by mouth daily.   Yes [provider]  cyanocobalamin  (VITAMIN B12) 1000 MCG tablet Take 1,000 mcg by mouth every Monday, Wednesday, and Friday.   Yes [provider]  ELIQUIS 2.5 MG TABS tablet Take 2.5 mg by mouth See admin instructions. Take 2.5 mg by mouth with lunch and at bedtime   Yes [provider]  esomeprazole  (NEXIUM ) 40 MG capsule Take 1 capsule (40 mg total) by mouth 2 (two) times daily before a meal. Patient taking differently: Take 40 mg by mouth daily. 04/19/17  Yes Theophilus Andrews, Tully GRADE, MD  ferrous sulfate 325 (65 FE) MG tablet Take 325 mg by mouth See admin instructions. Take 325 mg by mouth every other day with food   Yes [provider]  levocetirizine (XYZAL) 5 MG tablet Take 5 mg by mouth at bedtime.   Yes [provider]  Levothyroxine  Sodium 44 MCG CAPS Take 44 mcg by mouth at bedtime.   Yes [provider]  lisinopril (ZESTRIL) 40 MG tablet Take 40 mg by mouth daily.   Yes [provider]  ondansetron  (ZOFRAN -ODT) 4 MG disintegrating tablet Take 4 mg by mouth every 6 (six) hours as needed for nausea or vomiting (dissolve orally).   Yes [provider]  Ostomy Supplies (SKIN PREP WIPES) MISC Apply 1 Pad topically See admin instructions. Per MAR: Apply 1 new wipe to left great toe every 12 hours as needed for comfort/protection for corn   Yes [provider]  sennosides-docusate sodium  (SENOKOT-S) 8.6-50 MG tablet Take 2 tablets by mouth daily.   Yes [provider]  Vitamin D, Ergocalciferol, (DRISDOL) 1.25 MG (50000 UNIT) CAPS capsule Take 50,000 Units by mouth every 30 (thirty) days.   Yes [provider]    Current Facility-Administered Medications  Medication Dose Route Frequency Provider Last Rate Last Admin   pantoprazole  (PROTONIX ) injection 40 mg  40 mg Intravenous Q12H Rai, Ripudeep K, MD   40 mg at 12/08/23 0902   Current Outpatient Medications  Medication Sig Dispense Refill   amLODipine (NORVASC) 2.5 MG tablet Take 2.5 mg by mouth daily.     BAZA PROTECT MOISTURE BARRIER 12 % CREA Apply 1 application  topically See admin instructions. Apply ointment to perineum to prevent MASD 2/2 incontinence- every shift  Calcium Carbonate (CALTRATE 600 PO) Take 600 mg by mouth daily.     cyanocobalamin  (VITAMIN B12) 1000 MCG tablet Take 1,000 mcg by mouth every Monday, Wednesday, and Friday.     ELIQUIS 2.5 MG TABS tablet Take 2.5 mg by mouth See admin instructions. Take 2.5 mg by mouth with lunch and at bedtime     esomeprazole  (NEXIUM ) 40 MG capsule Take 1 capsule (40 mg total) by mouth 2 (two) times daily before a meal. (Patient taking  differently: Take 40 mg by mouth daily.) 60 capsule 2   ferrous sulfate 325 (65 FE) MG tablet Take 325 mg by mouth See admin instructions. Take 325 mg by mouth every other day with food     levocetirizine (XYZAL) 5 MG tablet Take 5 mg by mouth at bedtime.     Levothyroxine  Sodium 44 MCG CAPS Take 44 mcg by mouth at bedtime.     lisinopril (ZESTRIL) 40 MG tablet Take 40 mg by mouth daily.     ondansetron  (ZOFRAN -ODT) 4 MG disintegrating tablet Take 4 mg by mouth every 6 (six) hours as needed for nausea or vomiting (dissolve orally).     Ostomy Supplies (SKIN PREP WIPES) MISC Apply 1 Pad topically See admin instructions. Per MAR: Apply 1 new wipe to left great toe every 12 hours as needed for comfort/protection for corn     sennosides-docusate sodium  (SENOKOT-S) 8.6-50 MG tablet Take 2 tablets by mouth daily.     Vitamin D, Ergocalciferol, (DRISDOL) 1.25 MG (50000 UNIT) CAPS capsule Take 50,000 Units by mouth every 30 (thirty) days.      Allergies as of 12/07/2023   (No Known Allergies)    No family history on file.  Social History   Socioeconomic History   Marital status: Widowed    Spouse name: Not on file   Number of children: Not on file   Years of education: Not on file   Highest education level: Not on file  Occupational History   Not on file  Tobacco Use   Smoking status: Never   Smokeless tobacco: Never  Substance and Sexual Activity   Alcohol use: No   Drug use: Not on file   Sexual activity: Not on file  Other Topics Concern   Not on file  Social History Narrative   Not on file   Social Drivers of Health   Financial Resource Strain: Not on file  Food Insecurity: Not on file  Transportation Needs: Not on file  Physical Activity: Not on file  Stress: Not on file  Social Connections: Not on file  Intimate Partner Violence: Not on file    Review of Systems: As per HPI, all others negative  Physical Exam: Vital signs in last 24 hours: Temp:  [97.6 F (36.4  C)-99.1 F (37.3 C)] 98.4 F (36.9 C) (01/07 0858) Pulse Rate:  [59-87] 69 (01/07 0845) Resp:  [14-21] 18 (01/07 0845) BP: (100-141)/(46-68) 134/65 (01/07 0845) SpO2:  [91 %-99 %] 91 % (01/07 0845) Weight:  [75 kg] 75 kg (01/06 1541)   General:   Alert, elderly, cantankerous, profoundly presbyacutic, NAD Head:  Normocephalic and atraumatic. Eyes:  Sclera clear, no icterus.   Conjunctiva pink. Ears:  Presbyacusis Nose:  No deformity, discharge,  or lesions. Mouth:  No deformity or lesions.  Oropharynx pale and dry Neck:  Supple; no masses or thyromegaly. Lungs:  No visible respiratory distress Abdomen:  Soft, nontender and nondistended. No masses, hepatosplenomegaly or hernias noted. Normal bowel sounds, without guarding,  and without rebound.     Msk:  Symmetrical without gross deformities. Normal posture. Pulses:  Normal pulses noted. Extremities:  Without clubbing or edema. Neurologic:  Alert and  oriented x4; diffusely weak grossly normal neurologically. Skin:  ecchymose interspersed, otherwise Intact without significant lesions or rashes. Psych:  Alert and cooperative. Normal mood and affect.   Lab Results: Recent Labs    12/07/23 1611 12/07/23 2347 12/08/23 0930  WBC 7.9  --   --   HGB 8.6* 6.9* 9.5*  HCT 28.5* 23.3* 31.7*  PLT 209  --   --    BMET Recent Labs    12/07/23 2031  NA 135  K 4.5  CL 107  CO2 20*  GLUCOSE 87  BUN 53*  CREATININE 0.89  CALCIUM 8.5*   LFT Recent Labs    12/07/23 2031  PROT 5.0*  ALBUMIN 2.7*  AST 20  ALT 17  ALKPHOS 55  BILITOT 0.3   PT/INR No results for input(s): LABPROT, INR in the last 72 hours.  Studies/Results: CT ABDOMEN PELVIS W CONTRAST Result Date: 12/07/2023 CLINICAL DATA:  Coffee-ground emesis. EXAM: CT ABDOMEN AND PELVIS WITH CONTRAST TECHNIQUE: Multidetector CT imaging of the abdomen and pelvis was performed using the standard protocol following bolus administration of intravenous contrast. RADIATION  DOSE REDUCTION: This exam was performed according to the departmental dose-optimization program which includes automated exposure control, adjustment of the mA and/or kV according to patient size and/or use of iterative reconstruction technique. CONTRAST:  75mL OMNIPAQUE  IOHEXOL  300 MG/ML  SOLN COMPARISON:  None Available. FINDINGS: Lower chest: Moderate sized hiatal hernia. Visualized distal esophagus is circumferentially thick walled and fluid-filled. Pacer wires in the right heart. Bibasilar opacities, favor atelectasis or scarring. No effusions. Hepatobiliary: Numerous cysts scattered throughout the liver. No suspicious focal hepatic abnormality. Prior cholecystectomy. No biliary ductal dilatation. Pancreas: No focal abnormality or ductal dilatation. Spleen: No focal abnormality.  Normal size. Adrenals/Urinary Tract: Numerous bilateral renal cysts. No follow-up imaging recommended. No stones or hydronephrosis. Adrenal glands and urinary bladder unremarkable. Stomach/Bowel: Left colonic diverticulosis. No active diverticulitis. Stomach and small bowel decompressed. No evidence of bowel obstruction. Duodenal diverticulum in the region of the pancreatic head. Vascular/Lymphatic: Aortoiliac atherosclerosis. No evidence of aneurysm or adenopathy. Reproductive: Uterus and adnexa unremarkable.  No mass. Other: No free fluid or free air. Musculoskeletal: No acute bony abnormality. IMPRESSION: Moderate-sized hiatal hernia. Visualized distal esophagus is thick-walled and fluid-filled. This could be related to reflux and esophagitis. Left colonic diverticulosis. Aortoiliac atherosclerosis. Bibasilar atelectasis or scarring. Electronically Signed   By: Franky Crease M.D.   On: 12/07/2023 23:06   DG Chest Port 1 View Result Date: 12/07/2023 CLINICAL DATA:  Coffee-ground emesis EXAM: PORTABLE CHEST 1 VIEW COMPARISON:  None Available. FINDINGS: Left chest wall pacemaker leads project over the right atrium and ventricle. Low  lung volumes. Patchy bibasilar opacities. Trace blunting of the bilateral costophrenic angles. No pneumothorax. The heart size and mediastinal contours are within normal limits. No acute osseous abnormality. IMPRESSION: 1. Low lung volumes with patchy bibasilar opacities, which may represent atelectasis, aspiration, or pneumonia. 2. Trace blunting of the bilateral costophrenic angles, which may represent trace pleural effusions. Electronically Signed   By: Limin  Xu M.D.   On: 12/07/2023 17:21    Impression:   Hematemesis, coffee grounds?  No witnesses present and no bleeding while in ED.  Similar episode in 2018 requiring endoscopy showing large hiatal hernia with Cameron's erosions as well as esophagitis.  Suspect similar etiology here.  Acute on chronic anemia. History complete heart block s/p pacemaker. Chronic anticoagulation, on apixaban. Profound presbyacusis.  Plan:   PPI IV bid x 24-48 hours, then po bid (liquid likely better tolerated) indefinitely (and upon discharge). Daily CBC.  Transfuse as needed. Clear liquid diet ok from GI perspective. No endoscopy planned given patient's age, multiple comorbidities, and prior endoscopy 2018 for the same reason, in the absence of life-threatening hemorrhage. Would hold apixaban for at least 2 weeks if at all clinically feasible; if she has recurrent episodes of bleeding in future may need to consider cessation of this medication altogether. [facility] will follow.   LOS: 0 days   Brandalyn Harting M  12/08/2023, 10:48 AM  Cell [phone] If no answer or after 5 PM call [phone]

## 2023-12-08 NOTE — ED Notes (Signed)
 [place], niece called and given report on pt admission orders.

## 2023-12-08 NOTE — ED Provider Notes (Signed)
 Care assumed at midnight.  Patient here for evaluation following episode of hematemesis, currently on Eliquis.  No active vomiting in the emergency department.  Care assumed pending medicine admission.  Hemoglobin is downtrending.  Last dose of Eliquis was greater than 24 hours ago.  Plan to transfuse, admit for ongoing care.  Hospitalist consulted for admission.   Griselda Norris, MD 12/08/23 [phone]

## 2023-12-08 NOTE — H&P (Addendum)
 History and Physical    Patient: [name] [name] FMW:[id number] DOB: Oct 21, 1924 DOA: 12/07/2023 DOS: the patient was seen and examined on 12/08/2023 PCP: Melchor Ozell PARAS, MD  Patient coming from: SNF  Chief Complaint:  Chief Complaint  Patient presents with   Hematemesis   HPI: [name] [name] is a 89 y.o. female with medical history significant of  history of hypertension, hyperlipidemia, prior GERD/esophagitis, anemia, hard of hearing, complete heart block with a pacemaker who is currently on Eliquis who is presenting today from [facility] due to 1 episode of coffee ground emesis that they were concerned could be bloody. [name] Endorses nausea. Denies dizziness/lightheadedness, dyspnea, palpitations, abdominal pain, vomiting, chest pain, recent or frequent NSAID use. EMS reported she was pale and hypotensive when they arrived. Her last dose of Eliquis was 12/05/22 at 8 PM.   ED Course: On arrival to [facility] ED she was noted to be afebrile temp 37.3C BP 115/52, HR 81, RR 18, SpO2 97% on room air.  Labs notable for initial hemoglobin of 8.6 that dropped to 6.9 on recheck.  BUN elevated at 53. CT Abdomen and Pelvis obtained and shows Moderate-sized hiatal hernia, distal esophagus is thick-walled and fluid-filled. She received 2 units PRBCs, IV Protonix , and Zofran . EDP consulted Dr Kari with GI. [facility] contacted for admission.  Review of Systems: As mentioned in the history of present illness. All other systems reviewed and are negative. Past Medical History:  Diagnosis Date   Thyroid  disease    Past Surgical History:  Procedure Laterality Date   APPENDECTOMY     ESOPHAGOGASTRODUODENOSCOPY N/A 04/19/2017   Procedure: ESOPHAGOGASTRODUODENOSCOPY (EGD);  Surgeon: Teressa Toribio SQUIBB, MD;  Location: [facility];  Service: Endoscopy;  Laterality: N/A;   Social History:  reports that she has never smoked. She has never used smokeless tobacco. She reports that she does not drink  alcohol. No history on file for drug use.  No Known Allergies  No family history on file.  Prior to Admission medications   Medication Sig Start Date End Date Taking? Authorizing Provider  amLODipine (NORVASC) 2.5 MG tablet Take 2.5 mg by mouth daily.   Yes [provider]  BAZA PROTECT MOISTURE BARRIER 12 % CREA Apply 1 application  topically See admin instructions. Apply ointment to perineum to prevent MASD 2/2 incontinence- every shift   Yes [provider]  Calcium Carbonate (CALTRATE 600 PO) Take 600 mg by mouth daily.   Yes [provider]  cyanocobalamin  (VITAMIN B12) 1000 MCG tablet Take 1,000 mcg by mouth every Monday, Wednesday, and Friday.   Yes [provider]  ELIQUIS 2.5 MG TABS tablet Take 2.5 mg by mouth See admin instructions. Take 2.5 mg by mouth with lunch and at bedtime   Yes [provider]  esomeprazole  (NEXIUM ) 40 MG capsule Take 1 capsule (40 mg total) by mouth 2 (two) times daily before a meal. Patient taking differently: Take 40 mg by mouth daily. 04/19/17  Yes Theophilus Andrews, Tully GRADE, MD  ferrous sulfate 325 (65 FE) MG tablet Take 325 mg by mouth See admin instructions. Take 325 mg by mouth every other day with food   Yes [provider]  levocetirizine (XYZAL) 5 MG tablet Take 5 mg by mouth at bedtime.   Yes [provider]  Levothyroxine  Sodium 44 MCG CAPS Take 44 mcg by mouth at bedtime.   Yes [provider]  lisinopril (ZESTRIL) 40 MG tablet Take 40 mg by mouth daily.  Yes [provider]  ondansetron  (ZOFRAN -ODT) 4 MG disintegrating tablet Take 4 mg by mouth every 6 (six) hours as needed for nausea or vomiting (dissolve orally).   Yes [provider]  Ostomy Supplies (SKIN PREP WIPES) MISC Apply 1 Pad topically See admin instructions. Per MAR: Apply 1 new wipe to left great toe every 12 hours as needed for comfort/protection for corn   Yes [provider]   sennosides-docusate sodium  (SENOKOT-S) 8.6-50 MG tablet Take 2 tablets by mouth daily.   Yes [provider]  Vitamin D, Ergocalciferol, (DRISDOL) 1.25 MG (50000 UNIT) CAPS capsule Take 50,000 Units by mouth every 30 (thirty) days.   Yes [provider]    Physical Exam: Vitals:   12/08/23 0502 12/08/23 0717 12/08/23 0845 12/08/23 0858  BP: 122/61 (!) 141/68 134/65   Pulse: 68 (!) 59 69   Resp: 18 15 18    Temp: 97.7 F (36.5 C) 97.7 F (36.5 C)  98.4 F (36.9 C)  TempSrc: Oral Oral  Axillary  SpO2: 99% 97% 91%   Weight:      Height:       Constitutional: NAD, calm, comfortable Eyes: PERRL, lids and conjunctivae normal ENMT: Hard of hearing, Mucous membranes are moist. Posterior pharynx clear of any exudate or lesions.Normal dentition.  Neck: normal, supple, no masses, no thyromegaly Respiratory: clear to auscultation bilaterally, no wheezing, no crackles. Normal respiratory effort. No accessory muscle use.  Cardiovascular: Regular rate and rhythm, (+) murmur, no rubs or gallops. +1 bilateral lower extremity edema. 2+ radial and pedal pulses.  Abdomen: no tenderness, no masses palpated. No hepatosplenomegaly. Bowel sounds positive x4 quadrants.  Musculoskeletal: no clubbing / cyanosis. No joint deformity upper and lower extremities. Good ROM, no contractures. Normal muscle tone.  Skin: no rashes, lesions, ulcers.  Neurologic: Alert and oriented x 3. Psychiatric: Normal mood  Data Reviewed: CBC    Component Value Date/Time   WBC 7.9 12/07/2023 1611   RBC 2.86 (L) 12/07/2023 1611   HGB 9.5 (L) 12/08/2023 0930   HCT 31.7 (L) 12/08/2023 0930   PLT 209 12/07/2023 1611   MCV 99.7 12/07/2023 1611   MCH 30.1 12/07/2023 1611   MCHC 30.2 12/07/2023 1611   RDW 15.0 12/07/2023 1611   LYMPHSABS 0.8 12/07/2023 1611   MONOABS 0.6 12/07/2023 1611   EOSABS 0.0 12/07/2023 1611   BASOSABS 0.0 12/07/2023 1611   CMP     Component Value Date/Time   NA 135 12/07/2023  2031   K 4.5 12/07/2023 2031   CL 107 12/07/2023 2031   CO2 20 (L) 12/07/2023 2031   GLUCOSE 87 12/07/2023 2031   BUN 53 (H) 12/07/2023 2031   CREATININE 0.89 12/07/2023 2031   CALCIUM 8.5 (L) 12/07/2023 2031   PROT 5.0 (L) 12/07/2023 2031   ALBUMIN 2.7 (L) 12/07/2023 2031   AST 20 12/07/2023 2031   ALT 17 12/07/2023 2031   ALKPHOS 55 12/07/2023 2031   BILITOT 0.3 12/07/2023 2031   GFRNONAA 59 (L) 12/07/2023 2031   Results for orders placed or performed during the hospital encounter of 12/07/23  Resp panel by RT-PCR (RSV, Flu A&B, Covid)     Status: None   Collection Time: 12/07/23  4:45 PM  Result Value Ref Range Status   SARS Coronavirus 2 by RT PCR NEGATIVE NEGATIVE Final    Comment: (NOTE) SARS-CoV-2 target nucleic acids are NOT DETECTED.  The SARS-CoV-2 RNA is generally detectable in upper respiratory specimens during the acute phase of infection.  The lowest concentration of SARS-CoV-2 viral copies this assay can detect is 138 copies/mL. A negative result does not preclude SARS-Cov-2 infection and should not be used as the sole basis for treatment or other patient management decisions. A negative result may occur with  improper specimen collection/handling, submission of specimen other than nasopharyngeal swab, presence of viral mutation(s) within the areas targeted by this assay, and inadequate number of viral copies(<138 copies/mL). A negative result must be combined with clinical observations, patient history, and epidemiological information. The expected result is Negative.  Fact Sheet for Patients:  bloggercourse.com  Fact Sheet for Healthcare Providers:  seriousbroker.it  This test is no t yet approved or cleared by the [place]  FDA and  has been authorized for detection and/or diagnosis of SARS-CoV-2 by FDA under an Emergency Use Authorization (EUA). This EUA will remain  in effect (meaning this test can  be used) for the duration of the COVID-19 declaration under Section 564(b)(1) of the Act, 21 U.S.C.section 360bbb-3(b)(1), unless the authorization is terminated  or revoked sooner.       Influenza A by PCR NEGATIVE NEGATIVE Final   Influenza B by PCR NEGATIVE NEGATIVE Final    Comment: (NOTE) The Xpert Xpress SARS-CoV-2/FLU/RSV plus assay is intended as an aid in the diagnosis of influenza from Nasopharyngeal swab specimens and should not be used as a sole basis for treatment. Nasal washings and aspirates are unacceptable for Xpert Xpress SARS-CoV-2/FLU/RSV testing.  Fact Sheet for Patients: bloggercourse.com  Fact Sheet for Healthcare Providers: seriousbroker.it  This test is not yet approved or cleared by the [place]  FDA and has been authorized for detection and/or diagnosis of SARS-CoV-2 by FDA under an Emergency Use Authorization (EUA). This EUA will remain in effect (meaning this test can be used) for the duration of the COVID-19 declaration under Section 564(b)(1) of the Act, 21 U.S.C. section 360bbb-3(b)(1), unless the authorization is terminated or revoked.     Resp Syncytial Virus by PCR NEGATIVE NEGATIVE Final    Comment: (NOTE) Fact Sheet for Patients: bloggercourse.com  Fact Sheet for Healthcare Providers: seriousbroker.it  This test is not yet approved or cleared by the [place]  FDA and has been authorized for detection and/or diagnosis of SARS-CoV-2 by FDA under an Emergency Use Authorization (EUA). This EUA will remain in effect (meaning this test can be used) for the duration of the COVID-19 declaration under Section 564(b)(1) of the Act, 21 U.S.C. section 360bbb-3(b)(1), unless the authorization is terminated or revoked.  Performed at [facility], [address], [place], [place] [address]    CT ABDOMEN PELVIS W  CONTRAST Result Date: 12/07/2023 CLINICAL DATA:  Coffee-ground emesis. EXAM: CT ABDOMEN AND PELVIS WITH CONTRAST TECHNIQUE: Multidetector CT imaging of the abdomen and pelvis was performed using the standard protocol following bolus administration of intravenous contrast. RADIATION DOSE REDUCTION: This exam was performed according to the departmental dose-optimization program which includes automated exposure control, adjustment of the mA and/or kV according to patient size and/or use of iterative reconstruction technique. CONTRAST:  75mL OMNIPAQUE  IOHEXOL  300 MG/ML  SOLN COMPARISON:  None Available. FINDINGS: Lower chest: Moderate sized hiatal hernia. Visualized distal esophagus is circumferentially thick walled and fluid-filled. Pacer wires in the right heart. Bibasilar opacities, favor atelectasis or scarring. No effusions. Hepatobiliary: Numerous cysts scattered throughout the liver. No suspicious focal hepatic abnormality. Prior cholecystectomy. No biliary ductal dilatation. Pancreas: No focal abnormality or ductal dilatation. Spleen: No focal abnormality.  Normal size. Adrenals/Urinary Tract: Numerous bilateral renal  cysts. No follow-up imaging recommended. No stones or hydronephrosis. Adrenal glands and urinary bladder unremarkable. Stomach/Bowel: Left colonic diverticulosis. No active diverticulitis. Stomach and small bowel decompressed. No evidence of bowel obstruction. Duodenal diverticulum in the region of the pancreatic head. Vascular/Lymphatic: Aortoiliac atherosclerosis. No evidence of aneurysm or adenopathy. Reproductive: Uterus and adnexa unremarkable.  No mass. Other: No free fluid or free air. Musculoskeletal: No acute bony abnormality. IMPRESSION: Moderate-sized hiatal hernia. Visualized distal esophagus is thick-walled and fluid-filled. This could be related to reflux and esophagitis. Left colonic diverticulosis. Aortoiliac atherosclerosis. Bibasilar atelectasis or scarring. Electronically Signed    By: Franky Crease M.D.   On: 12/07/2023 23:06   DG Chest Port 1 View Result Date: 12/07/2023 CLINICAL DATA:  Coffee-ground emesis EXAM: PORTABLE CHEST 1 VIEW COMPARISON:  None Available. FINDINGS: Left chest wall pacemaker leads project over the right atrium and ventricle. Low lung volumes. Patchy bibasilar opacities. Trace blunting of the bilateral costophrenic angles. No pneumothorax. The heart size and mediastinal contours are within normal limits. No acute osseous abnormality. IMPRESSION: 1. Low lung volumes with patchy bibasilar opacities, which may represent atelectasis, aspiration, or pneumonia. 2. Trace blunting of the bilateral costophrenic angles, which may represent trace pleural effusions. Electronically Signed   By: Limin  Xu M.D.   On: 12/07/2023 17:21      Assessment and Plan: #Gastrointestinal Bleeding #Acute on Chronic Blood Loss Anemia HGB 6.9, BUN 53  Description of presentation: Coffee ground emesis at nursing home Endorses nausea. Denies dizziness/lightheadedness, dyspnea, palpitations, abdominal pain, vomiting, or chest pain, recent or frequent NSAID use. Was pale and hypotensive when EMS arrived.  Home Anticoagulation: Eliquis held Prior Endoluminal Eval/Results: 03/2017 --> Large hiatal hernia with Cameron's erosions and erosive distal reflux and esophagitis Imaging Results: Hiatal hernia. Distal esophagus thick walled and fluid filled consistent with esophagitis - Hold home Eliquis - Trend H&H, transfuse for HGB goal > 7 or hemodynamic instability - Protonix  IV q12H - Recheck PT/INR, correct coagulopathy - NPO - GI consulted by EDP, appreciate their recommendations and management  #GERD #Esophagitis - Protonix  IV q12H - Carafate    #Protein Calorie Malnutrition #Hypoalbuminemia - Consult dietician  #Hypertension -Continue home Amlodipine - Hold home lisinopril  #Hypothyroidism - Continue home Synthroid   #Hyperlipidemia  Does not appear to be on  outpatient medication  Advance Care Planning:   Code Status: Limited: Do not attempt resuscitation (DNR) -DNR-LIMITED -Do Not Intubate/DNI    Consults: GI  Family Communication: Landry Molt, POA updated via phone [phone]  Severity of Illness: The appropriate patient status for this patient is INPATIENT. Inpatient status is judged to be reasonable and necessary in order to provide the required intensity of service to ensure the patient's safety. The patient's presenting symptoms, physical exam findings, and initial radiographic and laboratory data in the context of their chronic comorbidities is felt to place them at high risk for further clinical deterioration. Furthermore, it is not anticipated that the patient will be medically stable for discharge from the hospital within 2 midnights of admission.   * I certify that at the point of admission it is my clinical judgment that the patient will require inpatient hospital care spanning beyond 2 midnights from the point of admission due to high intensity of service, high risk for further deterioration and high frequency of surveillance required.*   To reach the provider On-Call:   7AM- 7PM see care teams to locate the attending and reach out to them via [email or web address]. Password: [email or web address] 7PM-7AM contact night-coverage If you  still have difficulty reaching the appropriate provider, please page the [facility] (Director on Call) for [facility] on amion for assistance  This document was prepared using Conservation officer, historic buildings and may include unintentional dictation errors.  Rockie Rams FNP-BC, PMHNP-BC Nurse Practitioner [facility]

## 2023-12-09 DIAGNOSIS — D62 Acute posthemorrhagic anemia: Secondary | ICD-10-CM | POA: Diagnosis not present

## 2023-12-09 DIAGNOSIS — I480 Paroxysmal atrial fibrillation: Secondary | ICD-10-CM

## 2023-12-09 DIAGNOSIS — I10 Essential (primary) hypertension: Secondary | ICD-10-CM | POA: Diagnosis not present

## 2023-12-09 DIAGNOSIS — K922 Gastrointestinal hemorrhage, unspecified: Secondary | ICD-10-CM | POA: Diagnosis not present

## 2023-12-09 DIAGNOSIS — D6489 Other specified anemias: Secondary | ICD-10-CM | POA: Diagnosis not present

## 2023-12-09 LAB — TYPE AND SCREEN
ABO/RH(D): A NEG
Antibody Screen: NEGATIVE
Unit division: 0
Unit division: 0

## 2023-12-09 LAB — BPAM RBC
Blood Product Expiration Date: 202501282359
Blood Product Expiration Date: 202501282359
ISSUE DATE / TIME: 202501070125
ISSUE DATE / TIME: 202501070428
Unit Type and Rh: 600
Unit Type and Rh: 600

## 2023-12-09 LAB — HEMOGLOBIN AND HEMATOCRIT, BLOOD
HCT: 31 % — ABNORMAL LOW (ref 36.0–46.0)
Hemoglobin: 9.9 g/dL — ABNORMAL LOW (ref 12.0–15.0)

## 2023-12-09 MED ORDER — ONDANSETRON HCL 4 MG/2ML IJ SOLN: 4.0000 mg | Freq: Four times a day (QID) | INTRAMUSCULAR | Status: DC | PRN

## 2023-12-09 MED ORDER — ACETAMINOPHEN 325 MG PO TABS: 650.0000 mg | ORAL_TABLET | ORAL | Status: DC | PRN

## 2023-12-09 MED ORDER — SENNOSIDES-DOCUSATE SODIUM 8.6-50 MG PO TABS
2.0000 | ORAL_TABLET | Freq: Every day | ORAL | Status: DC
  Administered 2023-12-09: 2 via ORAL
  Filled 2023-12-09: qty 2

## 2023-12-09 MED ORDER — PANTOPRAZOLE SODIUM 40 MG PO TBEC: 40.0000 mg | DELAYED_RELEASE_TABLET | Freq: Two times a day (BID) | ORAL | Status: AC

## 2023-12-09 MED ORDER — LORATADINE 10 MG PO TABS: 10.0000 mg | ORAL_TABLET | Freq: Every day | ORAL | Status: DC

## 2023-12-09 MED ORDER — VITAMIN B-12 1000 MCG PO TABS
1000.0000 ug | ORAL_TABLET | ORAL | Status: DC
  Administered 2023-12-09: 1000 ug via ORAL
  Filled 2023-12-09: qty 1

## 2023-12-09 MED ORDER — SUCRALFATE 1 GM/10ML PO SUSP: 1.0000 g | Freq: Three times a day (TID) | ORAL | Status: AC

## 2023-12-09 NOTE — Discharge Summary (Signed)
 Physician Discharge Summary   Patient: [name] [name] MRN: [id number] DOB: [birth year]  Admit date:     12/07/2023  Discharge date: 12/09/23  Discharge Physician: Nydia Distance, MD    PCP: Melchor Ozell PARAS, MD   Recommendations at discharge:   Continue Protonix  40 mg p.o. twice daily indefinitely Carafate  1 g p.o. before every meal and at bedtime for at least 4 weeks Follow CBC in 1 week/Monday 12/14/2023 Follow-up outpatient with gastroenterology, Dr. Saintclair in 2 weeks Hold Eliquis for 2 weeks.  Strongly recommended patient follow-up with her cardiologist, Dr. Mozell Rinks, [facility] and discuss whether to resume Eliquis.  Discharge Diagnoses:    Upper GI bleed   Acute blood loss anemia   Hypothyroid   Gastroesophageal reflux disease with esophagitis   Essential hypertension   Protein calorie malnutrition (HCC)   Paroxysmal atrial fibrillation [facility])   Hospital Course:  Patient is a 89 year old female with HTN, HLP, prior GERD/esophagitis, anemia, complete heart block with pacemaker, paroxysmal atrial fibrillation, on Eliquis 2.5 mg twice daily (follows cardiology, Dr. Hildegard Rinks at [facility]), hypothyroidism, hearing deficit presented from SNF for evaluation due to coffee-ground emesis.  Patient did report nausea, no abdominal pain.  No active bleeding during encounter. In ED initial hemoglobin 8.6, then dropped to 6.9, vital signs stable. CT abdomen pelvis showed moderate-sized hiatal hernia, esophagitis   Assessment and Plan:  Acute blood loss anemia superimposed on chronic normocytic anemia GERD/esophagitis, upper GI bleed -Hemoglobin initially 8.6, then dropped to 6.9, transfused 2 units packed RBCs -Hold Eliquis -Started on Protonix  40 mg every 12 hours IV x 24-48hrs while inpatient.  Continue Protonix  40 mg twice daily indefinitely -GI was consulted, seen by Dr. Burnette, recommended no endoscopy planned given age, multiple comorbidities.  Prior endoscopy in 2018  for the same reason.  Recommended to hold apixaban for at least 2 weeks.  If she has recurrent episodes of bleeding in future, may need to consider cessation Hemoglobin 9.9 at discharge   History of paroxysmal atrial fibrillation, complete heart block with pacemaker -Currently heart rate controlled -Hold Eliquis, patient follows cardiology at [facility] -Per GI, continue to hold Eliquis for at least 2 weeks.  If recurrent episodes of GI bleeding in future may need to consider cessation  Hypothyroidism -Continue Synthroid   Hypertension -BP stable, continue amlodipine, hold lisinopril     Pain control - [place]  Controlled Substance Reporting System database was reviewed. and patient was instructed, not to drive, operate heavy machinery, perform activities at heights, swimming or participation in water activities or provide baby-sitting services while on Pain, Sleep and Anxiety Medications; until their outpatient Physician has advised to do so again. Also recommended to not to take more than prescribed Pain, Sleep and Anxiety Medications.  Consultants: Gastroenterology Procedures performed: None Disposition: Skilled nursing facility Diet recommendation:  Discharge Diet Orders (From admission, onward)     Start     Ordered   12/09/23 0000  Diet - low sodium heart healthy        12/09/23 0947           DISCHARGE MEDICATION: Allergies as of 12/09/2023   No Known Allergies      Medication List     PAUSE taking these medications    Eliquis 2.5 MG Tabs tablet Wait to take this until your doctor or other care provider tells you to start again. Generic drug: apixaban Take 2.5 mg by mouth See admin instructions. Take 2.5 mg by mouth with  lunch and at bedtime       STOP taking these medications    esomeprazole  40 MG capsule Commonly known as: NEXIUM    lisinopril 40 MG tablet Commonly known as: ZESTRIL       TAKE these medications    amLODipine 2.5 MG  tablet Commonly known as: NORVASC Take 2.5 mg by mouth daily.   Baza Protect Moisture Barrier 12 % Crea Generic drug: Zinc Oxide Apply 1 application  topically See admin instructions. Apply ointment to perineum to prevent MASD 2/2 incontinence- every shift   CALTRATE 600 PO Take 600 mg by mouth daily.   cyanocobalamin  1000 MCG tablet Commonly known as: VITAMIN B12 Take 1,000 mcg by mouth every Monday, Wednesday, and Friday.   ferrous sulfate 325 (65 FE) MG tablet Take 325 mg by mouth See admin instructions. Take 325 mg by mouth every other day with food   levocetirizine 5 MG tablet Commonly known as: XYZAL Take 5 mg by mouth at bedtime.   Levothyroxine  Sodium 44 MCG Caps Take 44 mcg by mouth at bedtime.   ondansetron  4 MG disintegrating tablet Commonly known as: ZOFRAN -ODT Take 4 mg by mouth every 6 (six) hours as needed for nausea or vomiting (dissolve orally).   pantoprazole  40 MG tablet Commonly known as: Protonix  Take 1 tablet (40 mg total) by mouth 2 (two) times daily before a meal.   sennosides-docusate sodium  8.6-50 MG tablet Commonly known as: SENOKOT-S Take 2 tablets by mouth daily.   Skin Prep Wipes Misc Apply 1 Pad topically See admin instructions. Per MAR: Apply 1 new wipe to left great toe every 12 hours as needed for comfort/protection for corn   sucralfate  1 GM/10ML suspension Commonly known as: CARAFATE  Take 10 mLs (1 g total) by mouth 4 (four) times daily -  with meals and at bedtime. X  4 weeks   Vitamin D (Ergocalciferol) 1.25 MG (50000 UNIT) Caps capsule Commonly known as: DRISDOL Take 50,000 Units by mouth every 30 (thirty) days.        Follow-up Information     Piazza, Michael J, MD. Schedule an appointment as soon as possible for a visit in 2 week(s).   Specialty: Internal Medicine Why: for hospital follow-up Contact information: [address] [place] [address] [phone]         Meldon Lash, MD. Schedule an appointment  as soon as possible for a visit in 2 week(s).   Specialty: Cardiology Why: for hospital follow-up, obtain labs CBC, discuss if eliquis can be resumed Contact information: [address] [place] [facility] [address] [phone]         Saintclair Jasper, MD. Schedule an appointment as soon as possible for a visit in 2 week(s).   Specialty: Gastroenterology Why: for hospital follow-up Contact information: [address] [place] [address] [phone]                Discharge Exam: Fredricka Weights   12/07/23 1541  Weight: 75 kg   S: No acute plaints, tolerating diet.  No further bleeding, nausea or vomiting, hematemesis.  BP 127/70   Pulse 61   Temp 97.6 F (36.4 C) (Axillary)   Resp 15   Ht 5' 1 (1.549 m)   Wt 75 kg   SpO2 95%   BMI 31.24 kg/m   Physical Exam General: Alert and oriented, NAD, hearing deficit Cardiovascular: S1 S2 clear, RRR.  Respiratory: CTAB, no wheezing, rales or rhonchi Gastrointestinal: Soft, nontender, nondistended, NBS  Ext: no pedal edema bilaterally Neuro: no new deficits Skin: No rashes   Condition at discharge: fair  The results of significant diagnostics from this hospitalization (including imaging, microbiology, ancillary and laboratory) are listed below for reference.   Imaging Studies: CT ABDOMEN PELVIS W CONTRAST Result Date: 12/07/2023 CLINICAL DATA:  Coffee-ground emesis. EXAM: CT ABDOMEN AND PELVIS WITH CONTRAST TECHNIQUE: Multidetector CT imaging of the abdomen and pelvis was performed using the standard protocol following bolus administration of intravenous contrast. RADIATION DOSE REDUCTION: This exam was performed according to the departmental dose-optimization program which includes automated exposure control, adjustment of the mA and/or kV according to patient size and/or use of iterative reconstruction technique. CONTRAST:  75mL OMNIPAQUE  IOHEXOL  300 MG/ML  SOLN COMPARISON:  None Available. FINDINGS: Lower  chest: Moderate sized hiatal hernia. Visualized distal esophagus is circumferentially thick walled and fluid-filled. Pacer wires in the right heart. Bibasilar opacities, favor atelectasis or scarring. No effusions. Hepatobiliary: Numerous cysts scattered throughout the liver. No suspicious focal hepatic abnormality. Prior cholecystectomy. No biliary ductal dilatation. Pancreas: No focal abnormality or ductal dilatation. Spleen: No focal abnormality.  Normal size. Adrenals/Urinary Tract: Numerous bilateral renal cysts. No follow-up imaging recommended. No stones or hydronephrosis. Adrenal glands and urinary bladder unremarkable. Stomach/Bowel: Left colonic diverticulosis. No active diverticulitis. Stomach and small bowel decompressed. No evidence of bowel obstruction. Duodenal diverticulum in the region of the pancreatic head. Vascular/Lymphatic: Aortoiliac atherosclerosis. No evidence of aneurysm or adenopathy. Reproductive: Uterus and adnexa unremarkable.  No mass. Other: No free fluid or free air. Musculoskeletal: No acute bony abnormality. IMPRESSION: Moderate-sized hiatal hernia. Visualized distal esophagus is thick-walled and fluid-filled. This could be related to reflux and esophagitis. Left colonic diverticulosis. Aortoiliac atherosclerosis. Bibasilar atelectasis or scarring. Electronically Signed   By: Franky Crease M.D.   On: 12/07/2023 23:06   DG Chest Port 1 View Result Date: 12/07/2023 CLINICAL DATA:  Coffee-ground emesis EXAM: PORTABLE CHEST 1 VIEW COMPARISON:  None Available. FINDINGS: Left chest wall pacemaker leads project over the right atrium and ventricle. Low lung volumes. Patchy bibasilar opacities. Trace blunting of the bilateral costophrenic angles. No pneumothorax. The heart size and mediastinal contours are within normal limits. No acute osseous abnormality. IMPRESSION: 1. Low lung volumes with patchy bibasilar opacities, which may represent atelectasis, aspiration, or pneumonia. 2. Trace  blunting of the bilateral costophrenic angles, which may represent trace pleural effusions. Electronically Signed   By: Limin  Xu M.D.   On: 12/07/2023 17:21    Microbiology: Results for orders placed or performed during the hospital encounter of 12/07/23  Resp panel by RT-PCR (RSV, Flu A&B, Covid)     Status: None   Collection Time: 12/07/23  4:45 PM  Result Value Ref Range Status   SARS Coronavirus 2 by RT PCR NEGATIVE NEGATIVE Final    Comment: (NOTE) SARS-CoV-2 target nucleic acids are NOT DETECTED.  The SARS-CoV-2 RNA is generally detectable in upper respiratory specimens during the acute phase of infection. The lowest concentration of SARS-CoV-2 viral copies this assay can detect is 138 copies/mL. A negative result does not preclude SARS-Cov-2 infection and should not be used as the sole basis for treatment or other patient management decisions. A negative result may occur with  improper specimen collection/handling, submission of specimen other than nasopharyngeal swab, presence of viral mutation(s) within the areas targeted by this assay, and inadequate number of viral copies(<138 copies/mL). A negative result must be combined with clinical observations, patient history, and epidemiological information. The expected result is Negative.  Fact  Sheet for Patients:  bloggercourse.com  Fact Sheet for Healthcare Providers:  seriousbroker.it  This test is no t yet approved or cleared by the [place]  FDA and  has been authorized for detection and/or diagnosis of SARS-CoV-2 by FDA under an Emergency Use Authorization (EUA). This EUA will remain  in effect (meaning this test can be used) for the duration of the COVID-19 declaration under Section 564(b)(1) of the Act, 21 U.S.C.section 360bbb-3(b)(1), unless the authorization is terminated  or revoked sooner.       Influenza A by PCR NEGATIVE NEGATIVE Final   Influenza B by  PCR NEGATIVE NEGATIVE Final    Comment: (NOTE) The Xpert Xpress SARS-CoV-2/FLU/RSV plus assay is intended as an aid in the diagnosis of influenza from Nasopharyngeal swab specimens and should not be used as a sole basis for treatment. Nasal washings and aspirates are unacceptable for Xpert Xpress SARS-CoV-2/FLU/RSV testing.  Fact Sheet for Patients: bloggercourse.com  Fact Sheet for Healthcare Providers: seriousbroker.it  This test is not yet approved or cleared by the [place]  FDA and has been authorized for detection and/or diagnosis of SARS-CoV-2 by FDA under an Emergency Use Authorization (EUA). This EUA will remain in effect (meaning this test can be used) for the duration of the COVID-19 declaration under Section 564(b)(1) of the Act, 21 U.S.C. section 360bbb-3(b)(1), unless the authorization is terminated or revoked.     Resp Syncytial Virus by PCR NEGATIVE NEGATIVE Final    Comment: (NOTE) Fact Sheet for Patients: bloggercourse.com  Fact Sheet for Healthcare Providers: seriousbroker.it  This test is not yet approved or cleared by the [place]  FDA and has been authorized for detection and/or diagnosis of SARS-CoV-2 by FDA under an Emergency Use Authorization (EUA). This EUA will remain in effect (meaning this test can be used) for the duration of the COVID-19 declaration under Section 564(b)(1) of the Act, 21 U.S.C. section 360bbb-3(b)(1), unless the authorization is terminated or revoked.  Performed at [facility], [address] [place], [place] [address]     Labs: CBC: Recent Labs  Lab 12/07/23 1611 12/07/23 2347 12/08/23 0930 12/09/23 0750  WBC 7.9  --   --   --   NEUTROABS 6.5  --   --   --   HGB 8.6* 6.9* 9.5* 9.9*  HCT 28.5* 23.3* 31.7* 31.0*  MCV 99.7  --   --   --   PLT 209  --   --   --    Basic Metabolic  Panel: Recent Labs  Lab 12/07/23 2031  NA 135  K 4.5  CL 107  CO2 20*  GLUCOSE 87  BUN 53*  CREATININE 0.89  CALCIUM 8.5*   Liver Function Tests: Recent Labs  Lab 12/07/23 2031  AST 20  ALT 17  ALKPHOS 55  BILITOT 0.3  PROT 5.0*  ALBUMIN 2.7*   CBG: No results for input(s): GLUCAP in the last 168 hours.  Discharge time spent: greater than 30 minutes.  Signed: Nydia Distance, MD [facility] 12/09/2023

## 2023-12-09 NOTE — ED Notes (Signed)
 Assisted pt with help, pt resting comfortable with light off.

## 2023-12-09 NOTE — Progress Notes (Signed)
 CSW met with pt and step daughter Greig Patterson at bedside and pt is adamant that she does not wish to go to SNF and would like to return to her room. CSW spoke with DON at [facility] who states she can return and family could complete paperwork upon return stating she wishes to waive her medicare rights. [facility] to transport. No further TOC needs.

## 2023-12-09 NOTE — ED Notes (Addendum)
 Changed pt into her clothes, changed pt brief. Pt now resting in recliner chair waiting for [facility], in [place]

## 2023-12-09 NOTE — Progress Notes (Signed)
 OT Cancellation Note  Patient Details Name: Neveyah Garzon MRN: [id number] DOB: September 05, 1924   Cancelled Treatment:    Reason Eval/Treat Not Completed: Other (comment) Patient is from [facility]. [facility] has been called to send patient back at this time with d/c order in chart. Defer skilled OT eval until next level of care as they will know patients PLOF. OT to sign off at this time Geof[place], [place] [facility] Department Office# [phone]  12/09/2023, 12:30 PM

## 2023-12-09 NOTE — ED Notes (Signed)
 Pt received breakfast tray

## 2023-12-09 NOTE — Progress Notes (Signed)
 PT Cancellation Note  Patient Details Name: [name] [name] MRN: [id number] DOB: [birth year]   Cancelled Treatment:    Reason Eval/Treat Not Completed: Other (comment)  Patient is from [facility]. [facility] has been called to send patient back at this time with d/c order in chart. Defer skilled PT eval until next level of care as she is discharging.  Should plan change PT will f/u.  Mickeal Daws, PT Acute Rehab [facility] [phone]  Benjiman VEAR [place] 12/09/2023, 1:11 PM

## 2023-12-09 NOTE — Progress Notes (Signed)
 Transport called.  [facility] also called and left message with admitting message board.  Will attempt to call again.
